# Patient Record
Sex: Female | Born: 1975 | Race: White | Hispanic: No | Marital: Married | Smoking: Never smoker
Health system: Southern US, Academic
[De-identification: ages and names within clinical notes are randomized; demographics above are authoritative.]

## PROBLEM LIST (undated history)

## (undated) HISTORY — PX: HX TONSILLECTOMY: SHX27

## (undated) HISTORY — PX: LYMPH NODE BIOPSY: SHX201

## (undated) HISTORY — PX: HX TUBAL LIGATION: SHX77

---

## 2009-05-06 DIAGNOSIS — C8190 Hodgkin lymphoma, unspecified, unspecified site: Secondary | ICD-10-CM

## 2009-05-06 HISTORY — DX: Hodgkin lymphoma, unspecified, unspecified site (CMS HCC): C81.90

## 2010-05-06 DIAGNOSIS — E039 Hypothyroidism, unspecified: Secondary | ICD-10-CM

## 2010-05-06 HISTORY — DX: Hypothyroidism, unspecified: E03.9

## 2010-10-22 DIAGNOSIS — E039 Hypothyroidism, unspecified: Secondary | ICD-10-CM | POA: Insufficient documentation

## 2014-05-06 DIAGNOSIS — G35 Multiple sclerosis: Secondary | ICD-10-CM

## 2014-05-06 HISTORY — DX: Multiple sclerosis (CMS HCC): G35

## 2014-10-22 DIAGNOSIS — G35 Multiple sclerosis: Secondary | ICD-10-CM | POA: Insufficient documentation

## 2019-05-07 DIAGNOSIS — O24419 Gestational diabetes mellitus in pregnancy, unspecified control: Secondary | ICD-10-CM

## 2019-05-07 HISTORY — DX: Gestational diabetes mellitus in pregnancy, unspecified control: O24.419

## 2019-06-22 DIAGNOSIS — E559 Vitamin D deficiency, unspecified: Secondary | ICD-10-CM | POA: Insufficient documentation

## 2019-06-22 DIAGNOSIS — O0993 Supervision of high risk pregnancy, unspecified, third trimester: Secondary | ICD-10-CM | POA: Insufficient documentation

## 2019-06-22 DIAGNOSIS — O30001 Twin pregnancy, unspecified number of placenta and unspecified number of amniotic sacs, first trimester: Secondary | ICD-10-CM | POA: Insufficient documentation

## 2019-06-25 DIAGNOSIS — Z8571 Personal history of Hodgkin lymphoma: Secondary | ICD-10-CM | POA: Insufficient documentation

## 2019-09-28 DIAGNOSIS — O24419 Gestational diabetes mellitus in pregnancy, unspecified control: Secondary | ICD-10-CM | POA: Insufficient documentation

## 2020-08-17 DIAGNOSIS — R739 Hyperglycemia, unspecified: Secondary | ICD-10-CM | POA: Insufficient documentation

## 2020-08-29 ENCOUNTER — Ambulatory Visit (HOSPITAL_COMMUNITY)
Admission: RE | Admit: 2020-08-29 | Discharge: 2020-08-29 | Disposition: A | Discharge status: Home / Self Care | Payer: Self-pay | Source: Ambulatory Visit

## 2020-12-06 ENCOUNTER — Ambulatory Visit (HOSPITAL_COMMUNITY)
Admission: RE | Admit: 2020-12-06 | Discharge: 2020-12-06 | Disposition: A | Discharge status: Home / Self Care | Payer: Self-pay | Source: Ambulatory Visit

## 2021-08-02 ENCOUNTER — Encounter (RURAL_HEALTH_CENTER): Payer: Self-pay | Admitting: INTERNAL MEDICINE-ENDOCRINOLOGY-DIABETES AND METABOLISM

## 2021-08-02 ENCOUNTER — Ambulatory Visit
Payer: BC Managed Care – PPO | Attending: INTERNAL MEDICINE-ENDOCRINOLOGY-DIABETES AND METABOLISM | Admitting: INTERNAL MEDICINE-ENDOCRINOLOGY-DIABETES AND METABOLISM

## 2021-08-02 ENCOUNTER — Other Ambulatory Visit: Payer: Self-pay

## 2021-08-02 ENCOUNTER — Other Ambulatory Visit (HOSPITAL_COMMUNITY): Payer: Self-pay

## 2021-08-02 VITALS — BP 118/85 | HR 88 | Temp 97.7°F | Resp 17 | Ht 66.0 in | Wt 211.4 lb

## 2021-08-02 DIAGNOSIS — E039 Hypothyroidism, unspecified: Secondary | ICD-10-CM | POA: Insufficient documentation

## 2021-08-02 DIAGNOSIS — Z923 Personal history of irradiation: Secondary | ICD-10-CM | POA: Insufficient documentation

## 2021-08-02 DIAGNOSIS — E162 Hypoglycemia, unspecified: Secondary | ICD-10-CM | POA: Insufficient documentation

## 2021-08-02 DIAGNOSIS — Z8571 Personal history of Hodgkin lymphoma: Secondary | ICD-10-CM | POA: Insufficient documentation

## 2021-08-02 DIAGNOSIS — E042 Nontoxic multinodular goiter: Secondary | ICD-10-CM | POA: Insufficient documentation

## 2021-08-02 DIAGNOSIS — Z6834 Body mass index (BMI) 34.0-34.9, adult: Secondary | ICD-10-CM | POA: Insufficient documentation

## 2021-08-02 DIAGNOSIS — Z79890 Hormone replacement therapy (postmenopausal): Secondary | ICD-10-CM | POA: Insufficient documentation

## 2021-08-02 MED ORDER — WEGOVY 0.25 MG/0.5 ML SUBCUTANEOUS PEN INJECTOR
0.2500 mg | PEN_INJECTOR | SUBCUTANEOUS | 0 refills | Status: DC
Start: 2021-08-02 — End: 2021-08-03

## 2021-08-02 MED ORDER — LEVOTHYROXINE 125 MCG TABLET
125.0000 ug | ORAL_TABLET | Freq: Every morning | ORAL | 1 refills | Status: DC
Start: 2021-08-02 — End: 2022-01-21

## 2021-08-02 NOTE — Progress Notes (Signed)
Family Medicine, [facility]  [place] [address]  [phone]    Date: 08/02/2021  Patient Name:  Nicole Carney   Age: 46 y.o.  Attending: Marena Chancy, MD - Endocrinology, Diabetes and Metabolism  PCP: Ripley Fraise, PA-C    I have personally reviewed allergies, chief complaint, medications, past medical history, surgical history, and family history.  I have reviewed referral notes, PCP progress notes and labs and images relevant to this visit.      Assessment/Recommendations:     1. Multinodular goiter  - 2012 hodgkins lymphoma, radiation to neck/chest  - [place] stable 08/2020 to 12/2020, agree repeat 12/2021  - largest nodule 1.0cm    2. Reactive hypoglycemia  - discussed diet and lifestyle modifications at length  - recommend lose 10-15lbs or 5% of body weight  - if no improvement in sugars, then consider acarbose  - strong family history of type 2 DM and she has a history of gestational DM    3. BMI 34  - start wegovy  - gave written instructions    4. Hypothyroidism  - increase levothyroxine from 112-125 given TSH is 2.7    No follow-ups on file.    Chief Complaint:    Chief Complaint   Patient presents with   . New Patient     hypothyroidism, hypoglycemia referral from [place] [facility]     History of Present Illness:   46 year old female presents for evaluation and management multinodular goiter, hypothyroidism, reactive hypoglycemia.  She has history of radiation for Hodgkin's lymphoma.  Has been on thyroid medications since 2015 or 2016.  TSH was 2.7.  Discussed increasing the dose of the medication.  Thyroid nodules are stable over 6 months. , recommend repeat in 1 year.  Discussed that with her in detail.  Prolonged discussion regarding diet lifestyle modification for reactive hypoglycemia.  Discussed medical weight loss.    History of hodkgins, multiple sclerosis.     Allergies:  Allergies   Allergen Reactions   . Sulfa (Sulfonamides)  Hives/ Urticaria     Medications:    Outpatient Medications Marked as Taking for the 08/02/21 encounter (Office Visit) with Lenward Chancellor, MD   Medication Sig   . levothyroxine (SYNTHROID) 112 mcg Oral Tablet TAKE 1 TABLET BY MOUTH ONCE DAILY FOR 30 DAYS   . phentermine (ADIPEX-P) 37.5 mg Oral Tablet Take 1 Tablet (37.5 mg total) by mouth Every morning before breakfast   . VUMERITY 231 mg Oral Capsule, Delayed Release(E.C.)        Past Medical History:  Past Medical History:   Diagnosis Date   . Gestational diabetes 2021   . Hodgkin's lymphoma (CMS [place]) 2011   . Hypothyroidism 2012   . MS (multiple sclerosis) (CMS [place]) 2016     Past Surgical History:   Procedure Laterality Date   . HX CESAREAN SECTION  2021   . HX TONSILLECTOMY     . HX TUBAL LIGATION     . LYMPH NODE BIOPSY      removal     Social History:    Social History     Socioeconomic History   . Marital status: Married   Tobacco Use   . Smoking status: Never     Passive exposure: Never   . Smokeless tobacco: Never   Vaping Use   . Vaping Use: Never used   Substance and Sexual Activity   . Alcohol use: Never   .  Drug use: Never   . Sexual activity: Yes     Partners: Male     Family History:  Family Medical History:     Problem Relation (Age of Onset)    Cancer Mother, Father    Cerebral Aneurysm Father    Cirrhosis Father    No Known Problems Brother        Review of Systems:   Constitutional: negative for fevers, chills, weight changes  HEENT: negative for acute vision changes or URI symptoms  Neck:  negative dysphonia, dysphagia or neck pain  Respiratory: negative for cough, sputum, dyspnea on exertion  Cardiovascular: negative for chest discomfort, palpitations  Gastrointestinal: negative for nausea, pain, stool changes  Genitourinary: negative for frequency, dysuria, nocturia, hesitancy  Hematologic/lymphatic: negative for changes in bleeding or bruisability  Musculoskeletal:negative for new myalgisa, arthralgia, and muscle weakness  Neurological:  negative for new headaches, vision changes, numbness, weakness, tingling  Psychiatric: mood stable, cognition stable  Endocrine:  As per HPI.    Objective:     Vital Signs:  BP 118/85   Pulse 88   Temp 36.5 C (97.7 F) (Temporal)   Resp 17   Ht 1.676 m ('5\' 6"'$ )   Wt 95.9 kg (211 lb 6.4 oz)   LMP 07/17/2021   SpO2 99%   BMI 34.12 kg/m      Body mass index is 34.12 kg/m.    Examination:  General:  Age-appropriate, no acute distress  HEENT:  Eyes clear.  Extraocular movements intact. Moist mucous membranes.  Neck:  Supple, no thyroid masses or adenopathy.  Cardiac:  Regular rate and rhythm, no gallops, murmurs or rubs.  Pulmonary:  Clear to ausculation bilaterally.  Pulmonary effort normal.  Gastrointestinal:  Nontender, nondistended, bowel sounds positive.    Musculoskeletal:  No deformity or lesions.  Skin:  Warm and of normal moisture.  Neurologic:  Alert and oriented.  Nonfocal.  No tremor.  Gait normal.  Psychiatric:  Affect normal.    Diagnostic Review:      No results found for: HA1C  No results found for: TSH, T3UP, MTUP1, FREET4, T3, THYSTIMUNQNT, THYSTIMMUNOG  No results found for: TSH, TSHCASCADE, T4, T4FREE, T3, FREET3    Marena Chancy, MD  Endocrinology, Diabetes and Metabolism    On the day of the encounter, a total of  60 minutes was spent on this patient encounter including review of historical information, examination, documentation and post-visit activities. The time documented excludes procedural time.    This note may have been partially generated using MModal Fluency Direct system, and there may be some incorrect words, spellings, and punctuation that were not noted in checking the note before saving.

## 2021-08-03 ENCOUNTER — Other Ambulatory Visit (RURAL_HEALTH_CENTER): Payer: Self-pay | Admitting: INTERNAL MEDICINE-ENDOCRINOLOGY-DIABETES AND METABOLISM

## 2021-08-03 ENCOUNTER — Telehealth (RURAL_HEALTH_CENTER): Payer: Self-pay | Admitting: INTERNAL MEDICINE-ENDOCRINOLOGY-DIABETES AND METABOLISM

## 2021-08-03 MED ORDER — OZEMPIC 0.25 MG OR 0.5 MG (2 MG/1.5 ML) SUBCUTANEOUS PEN INJECTOR
PEN_INJECTOR | SUBCUTANEOUS | 0 refills | Status: DC
Start: 2021-08-03 — End: 2021-08-28

## 2021-08-03 NOTE — Telephone Encounter (Signed)
Patient went to pick up her Nicole Carney and her insurance company wont cover it and even with the discount card its still pretty pricey. She called her insurance company to see what else could be done or taken in place and they said Ozempic would be covered she wasn't sure if she was able to switch or not. bnr,ma

## 2021-08-07 ENCOUNTER — Telehealth (RURAL_HEALTH_CENTER): Payer: Self-pay | Admitting: INTERNAL MEDICINE-ENDOCRINOLOGY-DIABETES AND METABOLISM

## 2021-08-07 NOTE — Telephone Encounter (Signed)
Unable to notify patient of scheduled thyroid US appointment on 12/10/2021'@11am'$  at [place].  Order was faxed to office and pt mailed appointment reminder.

## 2021-08-28 ENCOUNTER — Other Ambulatory Visit (RURAL_HEALTH_CENTER): Payer: Self-pay | Admitting: INTERNAL MEDICINE-ENDOCRINOLOGY-DIABETES AND METABOLISM

## 2021-08-28 DIAGNOSIS — R110 Nausea: Secondary | ICD-10-CM

## 2021-08-28 DIAGNOSIS — E669 Obesity, unspecified: Secondary | ICD-10-CM

## 2021-08-28 MED ORDER — ONDANSETRON HCL 4 MG TABLET
4.0000 mg | ORAL_TABLET | Freq: Three times a day (TID) | ORAL | 0 refills | Status: DC | PRN
Start: 2021-08-28 — End: 2021-10-11

## 2021-08-28 MED ORDER — OZEMPIC 0.25 MG OR 0.5 MG (2 MG/3 ML) SUBCUTANEOUS PEN INJECTOR
0.5000 mg | PEN_INJECTOR | SUBCUTANEOUS | 1 refills | Status: DC
Start: 2021-08-28 — End: 2021-10-11

## 2021-08-28 NOTE — Telephone Encounter (Signed)
Patient would like to get a script for Zofran to help with side effect. Also needs a new script for ozempic 0.5 mg.  Nmt, lpn

## 2021-09-21 ENCOUNTER — Ambulatory Visit (RURAL_HEALTH_CENTER): Payer: Self-pay | Admitting: INTERNAL MEDICINE-ENDOCRINOLOGY-DIABETES AND METABOLISM

## 2021-09-27 LAB — HM EGFR: GFR: 114 mL/min/{1.73_m2}

## 2021-09-27 LAB — POCT HGB A1C: POCT HGB A1C: 5.2 % (ref 4–6)

## 2021-09-28 ENCOUNTER — Other Ambulatory Visit (RURAL_HEALTH_CENTER): Payer: Self-pay

## 2021-09-28 ENCOUNTER — Ambulatory Visit (RURAL_HEALTH_CENTER): Payer: Self-pay | Admitting: INTERNAL MEDICINE-ENDOCRINOLOGY-DIABETES AND METABOLISM

## 2021-10-11 ENCOUNTER — Ambulatory Visit
Payer: BC Managed Care – PPO | Attending: Physician Assistant | Admitting: INTERNAL MEDICINE-ENDOCRINOLOGY-DIABETES AND METABOLISM

## 2021-10-11 ENCOUNTER — Encounter (RURAL_HEALTH_CENTER): Payer: Self-pay | Admitting: INTERNAL MEDICINE-ENDOCRINOLOGY-DIABETES AND METABOLISM

## 2021-10-11 ENCOUNTER — Other Ambulatory Visit: Payer: Self-pay

## 2021-10-11 VITALS — BP 125/87 | HR 85 | Temp 97.3°F | Resp 17 | Ht 66.0 in | Wt 202.4 lb

## 2021-10-11 DIAGNOSIS — E042 Nontoxic multinodular goiter: Secondary | ICD-10-CM | POA: Insufficient documentation

## 2021-10-11 DIAGNOSIS — Z79899 Other long term (current) drug therapy: Secondary | ICD-10-CM | POA: Insufficient documentation

## 2021-10-11 DIAGNOSIS — Z79890 Hormone replacement therapy (postmenopausal): Secondary | ICD-10-CM | POA: Insufficient documentation

## 2021-10-11 DIAGNOSIS — E162 Hypoglycemia, unspecified: Secondary | ICD-10-CM | POA: Insufficient documentation

## 2021-10-11 MED ORDER — OZEMPIC 1 MG/DOSE (4 MG/3 ML) SUBCUTANEOUS PEN INJECTOR
1.0000 mg | PEN_INJECTOR | SUBCUTANEOUS | 0 refills | Status: DC
Start: 2021-10-11 — End: 2021-11-14

## 2021-10-11 NOTE — Progress Notes (Signed)
Family Medicine, [facility]  [place] [address]  [phone]    Date: 10/11/2021  Patient Name:  Nicole Carney   Age: 46 y.o.  Attending: Marena Chancy, MD - Endocrinology, Diabetes and Metabolism  PCP: Ripley Fraise, PA-C    I have personally reviewed allergies, chief complaint, medications, past medical history, surgical history, and family history.  I have reviewed referral notes, PCP progress notes and labs and images relevant to this visit.      Assessment/Recommendations:     1. Multinodular goiter  - 2012 hodgkins lymphoma, radiation to neck/chest  - [place] stable 08/2020 to 12/2020, agree repeat 12/2021  - largest nodule 1.0cm    2. Reactive hypoglycemia  - discussed diet and lifestyle modifications at length  - A1C 5.2%, improving with ozempic  - strong family history of type 2 DM and she has a history of gestational DM    3. BMI 34  - semaglutide increase to '1mg'$  weekly  - gave written instructions    4. Hypothyroidism  - TSH at goal on LT4 125 daily, suspect will have to lower the dose if she continues losing weight    No follow-ups on file.    Chief Complaint:    Chief Complaint   Patient presents with   . Follow Up     6wk f\u     History of Present Illness:   46 year old female presents for evaluation and management multinodular goiter, hypothyroidism, reactive hypoglycemia.  She has history of radiation for Hodgkin's lymphoma.  Has been on thyroid medications since 2015 or 2016.  TSH was 2.7.  Discussed increasing the dose of the medication.  Thyroid nodules are stable over 6 months. , recommend repeat in 1 year.  Discussed that with her in detail.  Prolonged discussion regarding diet lifestyle modification for reactive hypoglycemia.  Discussed medical weight loss.    History of hodkgins, multiple sclerosis.     10/11/2021:  46 year old female returns for follow-up.  History multinodular goiter, hypothyroidism and reactive hypoglycemia.   Patient has been on semaglutide for weight loss and hypoglycemia frequency has reduced drastically.  She is losing weight.  Had labs and I reviewed those with her.  No complaints today    Allergies:  Allergies   Allergen Reactions   . Sulfa (Sulfonamides) Hives/ Urticaria     Medications:    Outpatient Medications Marked as Taking for the 10/11/21 encounter (Office Visit) with Lenward Chancellor, MD   Medication Sig   . levothyroxine (SYNTHROID) 125 mcg Oral Tablet Take 1 Tablet (125 mcg total) by mouth Every morning   . rOPINIRole (REQUIP) 0.25 mg Oral Tablet TAKE 1 TABLET BY MOUTH ONCE DAILY 1 TO 3 HOURS BEFORE BEDTIME   . semaglutide (OZEMPIC) 0.25 mg or 0.5 mg (2 mg/3 mL) Subcutaneous Pen Injector Inject 0.5 mg under the skin Every 7 days   . VUMERITY 231 mg Oral Capsule, Delayed Release(E.C.)        Past Medical History:  Past Medical History:   Diagnosis Date   . Gestational diabetes 2021   . Hodgkin's lymphoma (CMS [place]) 2011   . Hypothyroidism 2012   . MS (multiple sclerosis) (CMS [place]) 2016     Past Surgical History:   Procedure Laterality Date   . HX CESAREAN SECTION  2021   . HX TONSILLECTOMY     . HX TUBAL LIGATION     . LYMPH NODE BIOPSY  removal     Social History:    Social History     Socioeconomic History   . Marital status: Married   Tobacco Use   . Smoking status: Never     Passive exposure: Never   . Smokeless tobacco: Never   Vaping Use   . Vaping Use: Never used   Substance and Sexual Activity   . Alcohol use: Never   . Drug use: Never   . Sexual activity: Yes     Partners: Male     Family History:  Family Medical History:     Problem Relation (Age of Onset)    Cancer Mother, Father    Cerebral Aneurysm Father    Cirrhosis Father    No Known Problems Brother        Review of Systems:   Constitutional: negative for fevers, chills, weight changes  HEENT: negative for acute vision changes or URI symptoms  Neck:  negative dysphonia, dysphagia or neck pain  Respiratory: negative for cough, sputum,  dyspnea on exertion  Cardiovascular: negative for chest discomfort, palpitations  Gastrointestinal: negative for nausea, pain, stool changes  Genitourinary: negative for frequency, dysuria, nocturia, hesitancy  Hematologic/lymphatic: negative for changes in bleeding or bruisability  Musculoskeletal:negative for new myalgisa, arthralgia, and muscle weakness  Neurological: negative for new headaches, vision changes, numbness, weakness, tingling  Psychiatric: mood stable, cognition stable  Endocrine:  As per HPI.    Objective:     Vital Signs:  BP 125/87   Pulse 85   Temp 36.3 C (97.3 F)   Resp 17   Ht 1.676 m ('5\' 6"'$ )   Wt 91.8 kg (202 lb 6.4 oz)   SpO2 98%   BMI 32.67 kg/m      Body mass index is 32.67 kg/m.    Examination:  General:  Age-appropriate, no acute distress  HEENT:  Eyes clear.  Extraocular movements intact. Moist mucous membranes.  Neck:  Supple, no thyroid masses or adenopathy.  Cardiac:  Regular rate and rhythm, no gallops, murmurs or rubs.  Pulmonary:  Clear to ausculation bilaterally.  Pulmonary effort normal.  Gastrointestinal:  Nontender, nondistended, bowel sounds positive.    Musculoskeletal:  No deformity or lesions.  Skin:  Warm and of normal moisture.  Neurologic:  Alert and oriented.  Nonfocal.  No tremor.  Gait normal.  Psychiatric:  Affect normal.    Diagnostic Review:      No results found for: HA1C  No results found for: TSH, T3UP, MTUP1, FREET4, T3, THYSTIMUNQNT, THYSTIMMUNOG  No results found for: TSH, TSHCASCADE, T4, T4FREE, T3, FREET3    Marena Chancy, MD  Endocrinology, Diabetes and Metabolism    This note may have been partially generated using MModal Fluency Direct system, and there may be some incorrect words, spellings, and punctuation that were not noted in checking the note before saving.

## 2021-10-19 ENCOUNTER — Other Ambulatory Visit: Payer: Self-pay

## 2021-11-14 ENCOUNTER — Encounter (RURAL_HEALTH_CENTER): Payer: Self-pay | Admitting: INTERNAL MEDICINE-ENDOCRINOLOGY-DIABETES AND METABOLISM

## 2021-11-14 DIAGNOSIS — E669 Obesity, unspecified: Secondary | ICD-10-CM

## 2021-11-14 MED ORDER — OZEMPIC 2 MG/DOSE (8 MG/3 ML) SUBCUTANEOUS PEN INJECTOR
2.0000 mg | PEN_INJECTOR | SUBCUTANEOUS | 5 refills | Status: DC
Start: 2021-11-14 — End: 2022-05-28

## 2021-11-14 NOTE — Telephone Encounter (Signed)
From: Alecia Lemming  To: Lenward Chancellor, MD  Sent: 11/14/2021 9:31 AM EDT  Subject: Ozempic     I need to go up in dose for Sunday. Can you send me in a new script for it please. Thank you.

## 2021-11-22 ENCOUNTER — Encounter (RURAL_HEALTH_CENTER): Payer: Self-pay | Admitting: INTERNAL MEDICINE-ENDOCRINOLOGY-DIABETES AND METABOLISM

## 2021-12-17 ENCOUNTER — Encounter (RURAL_HEALTH_CENTER): Payer: Self-pay | Admitting: INTERNAL MEDICINE-ENDOCRINOLOGY-DIABETES AND METABOLISM

## 2022-01-03 ENCOUNTER — Encounter (RURAL_HEALTH_CENTER): Payer: Self-pay | Admitting: INTERNAL MEDICINE-ENDOCRINOLOGY-DIABETES AND METABOLISM

## 2022-01-03 ENCOUNTER — Other Ambulatory Visit: Payer: Self-pay

## 2022-01-03 ENCOUNTER — Ambulatory Visit: Payer: BC Managed Care – PPO | Admitting: INTERNAL MEDICINE-ENDOCRINOLOGY-DIABETES AND METABOLISM

## 2022-01-03 VITALS — BP 132/90 | HR 87 | Temp 97.5°F | Resp 17 | Ht 65.98 in | Wt 189.4 lb

## 2022-01-03 DIAGNOSIS — E161 Other hypoglycemia: Secondary | ICD-10-CM | POA: Insufficient documentation

## 2022-01-03 DIAGNOSIS — E669 Obesity, unspecified: Secondary | ICD-10-CM | POA: Insufficient documentation

## 2022-01-03 DIAGNOSIS — E042 Nontoxic multinodular goiter: Secondary | ICD-10-CM | POA: Insufficient documentation

## 2022-01-03 DIAGNOSIS — Z800 Family history of malignant neoplasm of digestive organs: Secondary | ICD-10-CM | POA: Insufficient documentation

## 2022-01-03 DIAGNOSIS — E039 Hypothyroidism, unspecified: Secondary | ICD-10-CM | POA: Insufficient documentation

## 2022-01-03 DIAGNOSIS — Z6834 Body mass index (BMI) 34.0-34.9, adult: Secondary | ICD-10-CM | POA: Insufficient documentation

## 2022-01-03 NOTE — Progress Notes (Signed)
Family Medicine, [facility]  [place] [address]  [phone]    Date: 01/03/2022  Patient Name:  Nicole Carney   Age: 46 y.o.  Attending: Marena Chancy, MD - Endocrinology, Diabetes and Metabolism  PCP: Ripley Fraise, PA-C    I have personally reviewed allergies, chief complaint, medications, past medical history, surgical history, and family history.  I have reviewed referral notes, PCP progress notes and labs and images relevant to this visit.      Assessment/Recommendations:     1. Multinodular goiter  - 2012 hodgkins lymphoma, radiation to neck/chest  - [place] stable 08/2021, repeat 1 year. Recommend 5 years of monitoring this is year 2  - largest nodule 1.0cm    2. Reactive hypoglycemia  - discussed diet and lifestyle modifications at length  - A1C 5.2%, improving with ozempic  - strong family history of type 2 DM and she has a history of gestational DM    3. BMI 34  - semaglutide '2mg'$  weekly    4. Hypothyroidism  - TSH at goal on LT4 125 daily, suspect will have to lower the dose if she continues losing weight    5. Family history of pancreatic cancer  - strong family history, mother  - needs to have imaging either EUS or MRI yearly. There are new guidelines for this. Recommend she see gastroenterology, will refer to GI at [facility]    No follow-ups on file.    Chief Complaint:    Chief Complaint   Patient presents with    Follow Up 3 Months     History of Present Illness:   46 year old female presents for evaluation and management multinodular goiter, hypothyroidism, reactive hypoglycemia.  She has history of radiation for Hodgkin's lymphoma.  Has been on thyroid medications since 2015 or 2016.  TSH was 2.7.  Discussed increasing the dose of the medication.  Thyroid nodules are stable over 6 months. , recommend repeat in 1 year.  Discussed that with her in detail.  Prolonged discussion regarding diet lifestyle modification for reactive  hypoglycemia.  Discussed medical weight loss.    History of hodkgins, multiple sclerosis.     10/11/2021:  46 year old female returns for follow-up.  History multinodular goiter, hypothyroidism and reactive hypoglycemia.  Patient has been on semaglutide for weight loss and hypoglycemia frequency has reduced drastically.  She is losing weight.  Had labs and I reviewed those with her.  No complaints today    01/03/2022:  46 year old female returns for follow-up.  History of multinodular goiter, hypothyroidism and reactive hypoglycemia.  She is on semaglutide and all of the sugar symptoms have improved.  She is losing weight.  Thyroid nodules are stable.  She has a strong family history of pancreatic cancer.  Has had CA 19 9 testing but not imaging.  I discussed the new guidelines recommend imaging, the frequency the pens on the earliest age of the family members with pancreatic cancer.  She needs to see a gastroenterologist for the screening.    Allergies:  Allergies   Allergen Reactions    Sulfa (Sulfonamides) Hives/ Urticaria     Medications:    Outpatient Medications Marked as Taking for the 01/03/22 encounter (Office Visit) with Lenward Chancellor, MD   Medication Sig    phentermine (ADIPEX-P) 37.5 mg Oral Tablet Take 1 Tablet (37.5 mg total) by mouth Once a day       Past Medical History:  Past Medical History:  Diagnosis Date    Gestational diabetes 2021    Hodgkin's lymphoma (CMS [facility]) 2011    Hypothyroidism 2012    MS (multiple sclerosis) (CMS [place]) 2016     Past Surgical History:   Procedure Laterality Date    HX CESAREAN SECTION  2021    HX TONSILLECTOMY      HX TUBAL LIGATION      LYMPH NODE BIOPSY      removal     Social History:    Social History     Socioeconomic History    Marital status: Married   Tobacco Use    Smoking status: Never     Passive exposure: Never    Smokeless tobacco: Never   Vaping Use    Vaping Use: Never used   Substance and Sexual Activity    Alcohol use: Never    Drug use: Never    Sexual  activity: Yes     Partners: Male     Family History:  Family Medical History:       Problem Relation (Age of Onset)    Cancer Mother, Father    Cerebral Aneurysm Father    Cirrhosis Father    No Known Problems Brother          Objective:     Vital Signs:  There were no vitals taken for this visit.     There is no height or weight on file to calculate BMI.    Examination:  General:  Age-appropriate, no acute distress  HEENT:  Eyes clear.  Extraocular movements intact. Moist mucous membranes.  Neck:  Supple, no thyroid masses or adenopathy.  Cardiac:  Regular rate and rhythm, no gallops, murmurs or rubs.  Pulmonary:  Clear to ausculation bilaterally.  Pulmonary effort normal.  Gastrointestinal:  Nontender, nondistended, bowel sounds positive.    Musculoskeletal:  No deformity or lesions.  Skin:  Warm and of normal moisture.  Neurologic:  Alert and oriented.  Nonfocal.  No tremor.  Gait normal.  Psychiatric:  Affect normal.    Diagnostic Review:      No results found for: HA1C  No results found for: TSH, T3UP, MTUP1, FREET4, T3, THYSTIMUNQNT, THYSTIMMUNOG  No results found for: TSH, TSHCASCADE, T4, T4FREE, T3, FREET3    Marena Chancy, MD  Endocrinology, Diabetes and Metabolism    This note may have been partially generated using MModal Fluency Direct system, and there may be some incorrect words, spellings, and punctuation that were not noted in checking the note before saving.

## 2022-01-11 ENCOUNTER — Ambulatory Visit (RURAL_HEALTH_CENTER): Payer: Self-pay | Admitting: INTERNAL MEDICINE-ENDOCRINOLOGY-DIABETES AND METABOLISM

## 2022-01-11 IMAGING — MR MRI BRAIN W/O CONTRAST
9 of 11 series · 38 of 48 positions shown · non-contrast
Comparison: MRI brain dated 02/05/2021.  Prior outside MRI brain dated 02/16/2020, 02/12/2019.

﻿EXAM:  MRI BRAIN W/O CONTRAST
INDICATION: 46-year-old for follow-up of multiple sclerosis.  History of Hodgkin’s lymphoma.
TECHNIQUE: Axial, coronal and sagittal images following MS protocol including axial and sagittal FLAIR sequences without contrast.

[Series 4: s-map · sagittal · 9.4mm · 4.69mm/px · 6 of 64 slices shown]
[im 1/64]
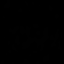
[im 13/64]
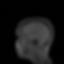
[im 26/64]
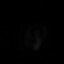
[im 38/64]
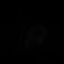
[im 51/64]
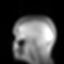
[im 64/64]
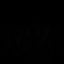

[Series 5: DWI · axial · 5.0mm · 1.35mm/px · z∈[-72,+66]mm · 8 of 96 slices shown (1 of 3)]
[im 1/96]
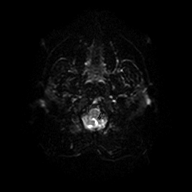
[im 11/96]
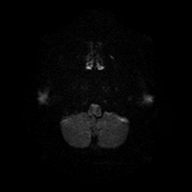
[im 32/96]
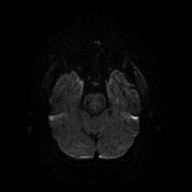
[im 43/96]
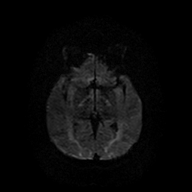
[im 53/96]
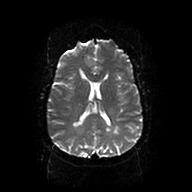
[im 64/96]
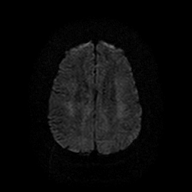
[im 85/96]
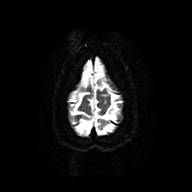
[im 96/96]
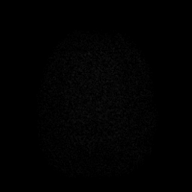

[Series 6: DWI · axial · 5.0mm · 1.35mm/px · z∈[-72,+66]mm · 3 of 24 slices shown (2 of 3)]
[im 1/24]
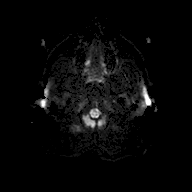
[im 12/24]
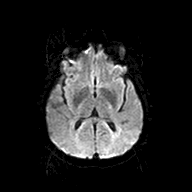
[im 24/24]
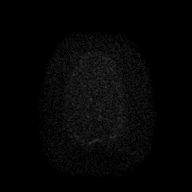

[Series 7: DWI · axial · 5.0mm · 1.35mm/px · z∈[-72,+66]mm · 3 of 24 slices shown (3 of 3)]
[im 1/24]
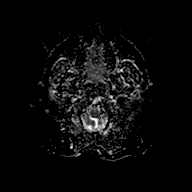
[im 12/24]
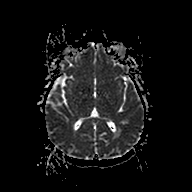
[im 24/24]
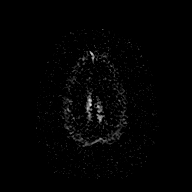

[Series 8: FLAIR · sagittal · 5.0mm · 0.75mm/px · 3 of 28 slices shown (1 of 2)]
[im 1/28]
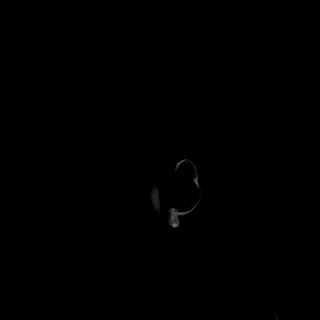
[im 14/28]
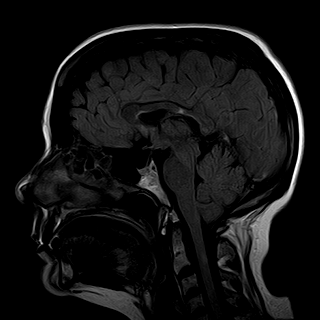
[im 28/28]
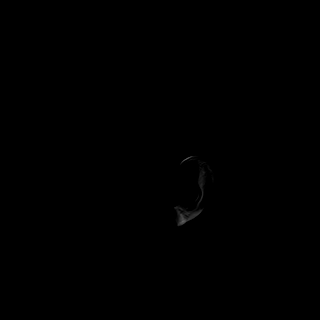

[Series 9: PD · axial · 4.0mm · 0.43mm/px · z∈[-79,+75]mm · 4 of 36 slices shown]
[im 1/36]
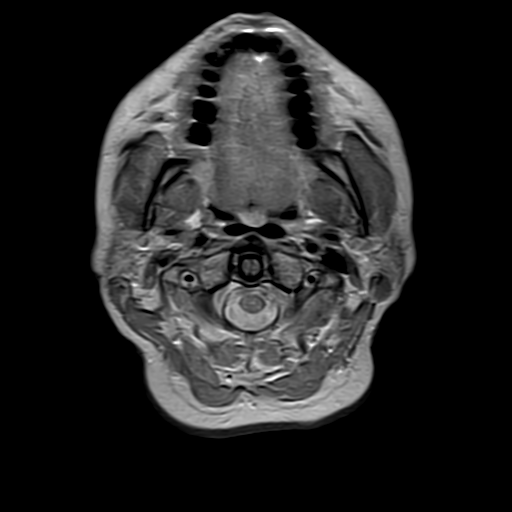
[im 12/36]
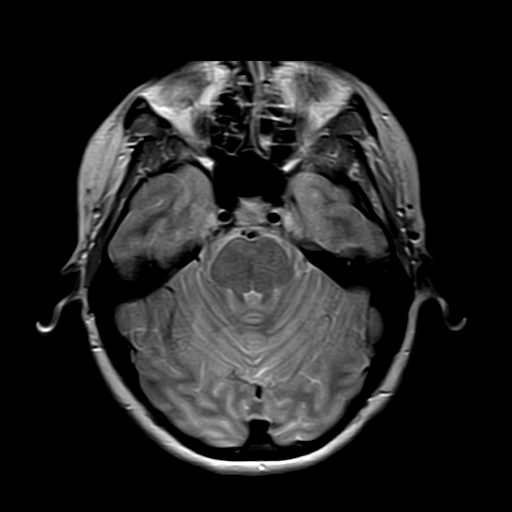
[im 24/36]
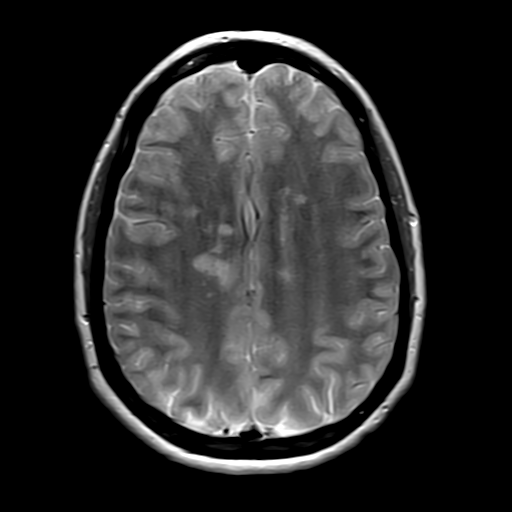
[im 36/36]
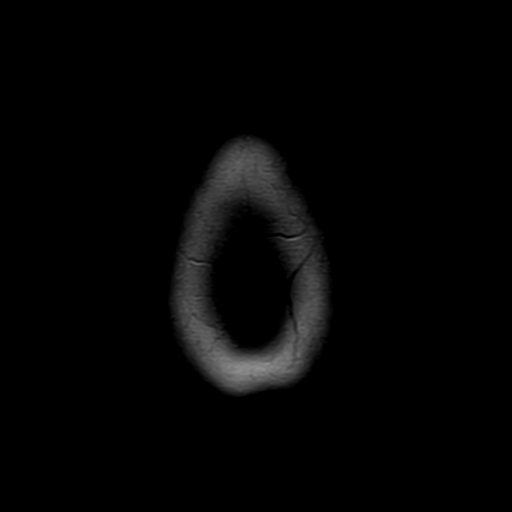

[Series 11: FLAIR · axial · 4.0mm · 0.43mm/px · z∈[-79,+75]mm · 4 of 36 slices shown (2 of 2)]
[im 1/36]
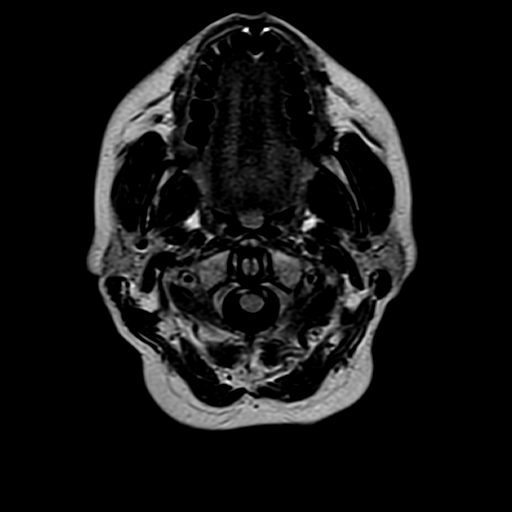
[im 12/36]
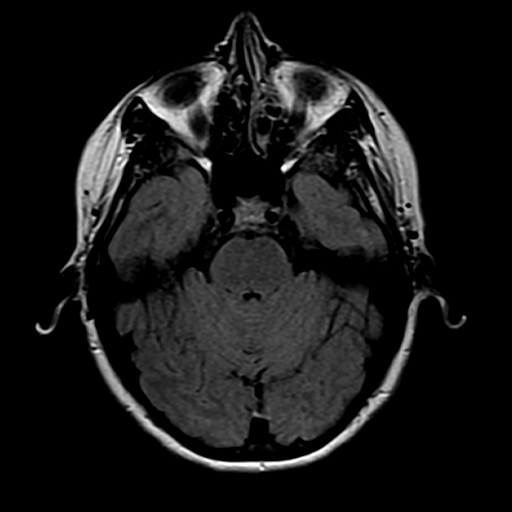
[im 24/36]
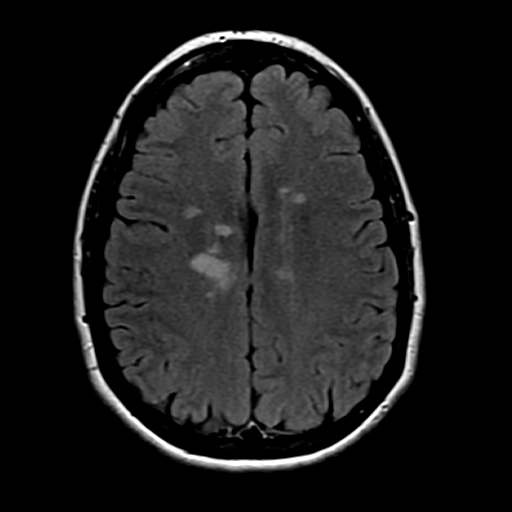
[im 36/36]
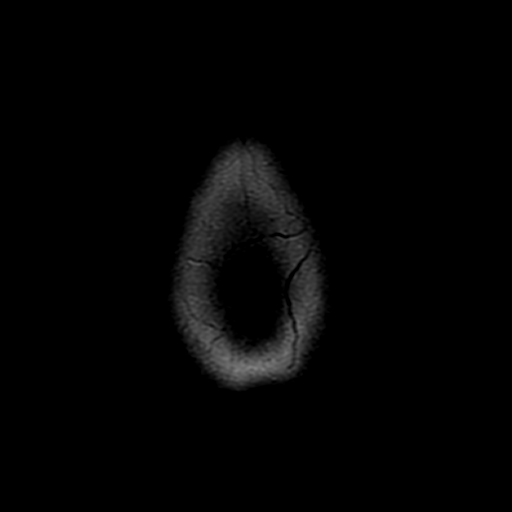

[Series 13: T1 · axial · 4.0mm · 0.43mm/px · z∈[-79,+75]mm · 4 of 36 slices shown]
[im 1/36]
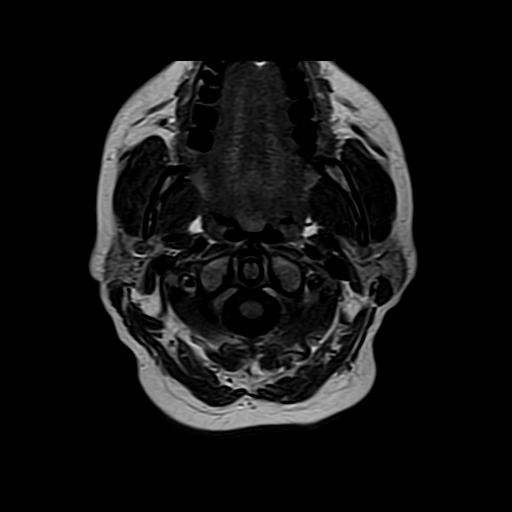
[im 12/36]
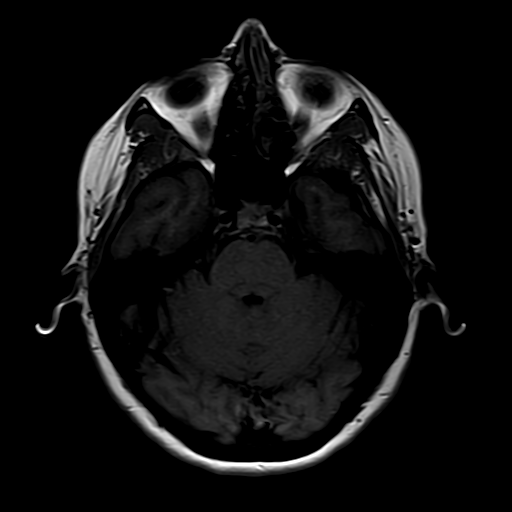
[im 24/36]
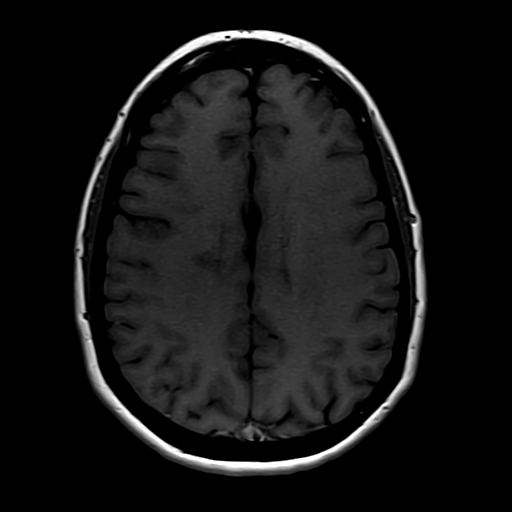
[im 36/36]
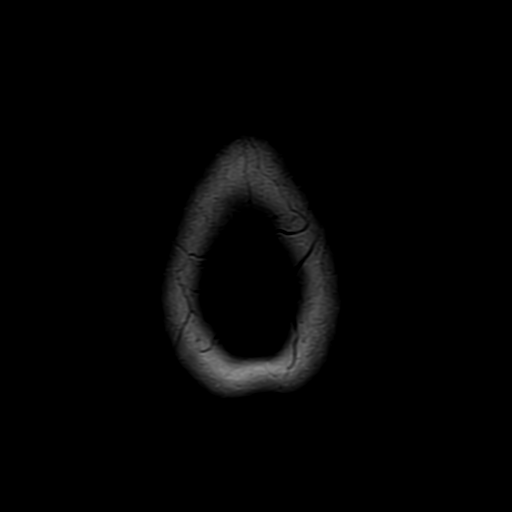

[Series 14: T2 · coronal · 5.2mm · 0.43mm/px · 3 of 28 slices shown]
[im 1/28]
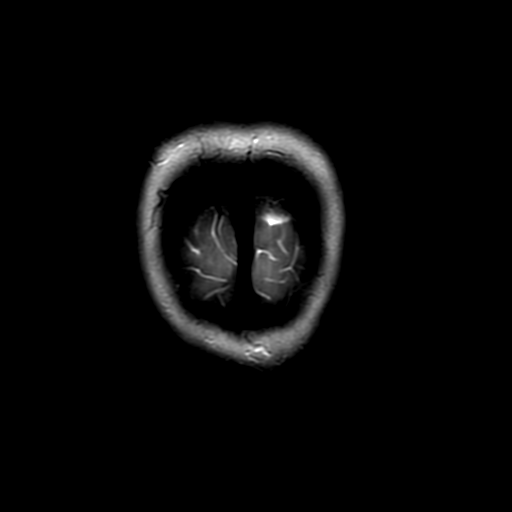
[im 14/28]
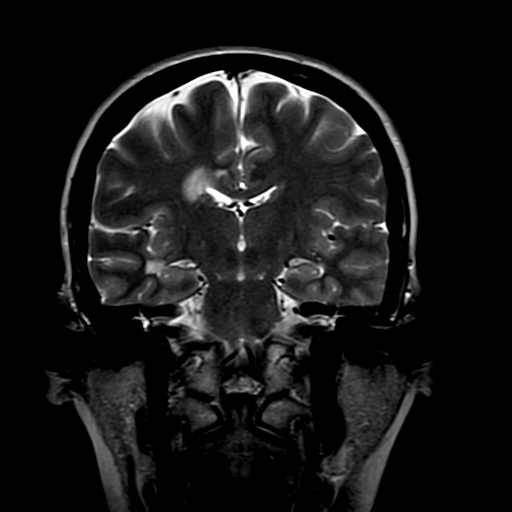
[im 28/28]
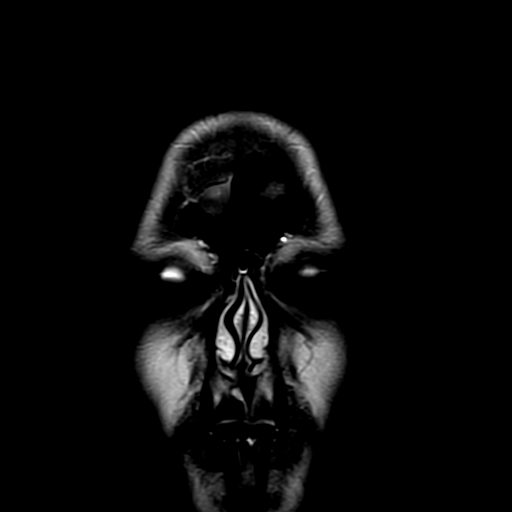

[38 of 48 positions shown; findings below may reference images not displayed]

FINDINGS: No focal restricted diffusion.  Multiple T2 FLAIR bright lesions of periventricular white matter are noted in typical distribution of multiple sclerosis.  Approximately 8-10 T2, FLAIR bright lesions of the right side are noted measuring up to 17 mm in long axis and up to 4-5 on the left side measuring up to 14 mm in long axis.  FLAIR bright lesion on the body of the corpus callosum is also noted.  

No abnormal FLAIR T2 bright lesions of the brainstem or cerebellum are seen.  However, ill-defined focus of increased FLAIR signal of cervical spinal cord at C2-C3 level is noted.  

No evidence of cerebral cortical atrophy.  Major vascular structures are patent.
IMPRESSION: 1. Multiple periventricular demyelinating lesions typical of multiple sclerosis, right more than the left and FLAIR bright lesion of the body of the corpus callosum are noted.  

2. Small focus of FLAIR bright lesion of cervical spinal cord at C2-C3 level.  

3. No focal areas of restricted diffusion. 

4. Overall, MRI findings consistent with multiple sclerosis are stable in appearance compared with prior MRI study of 02/05/2021 as well as 02/16/2020.

## 2022-01-21 ENCOUNTER — Other Ambulatory Visit (RURAL_HEALTH_CENTER): Payer: Self-pay | Admitting: INTERNAL MEDICINE-ENDOCRINOLOGY-DIABETES AND METABOLISM

## 2022-01-21 DIAGNOSIS — E039 Hypothyroidism, unspecified: Secondary | ICD-10-CM

## 2022-01-21 MED ORDER — LEVOTHYROXINE 125 MCG TABLET
125.0000 ug | ORAL_TABLET | Freq: Every morning | ORAL | 1 refills | Status: DC
Start: 2022-01-21 — End: 2022-07-04

## 2022-02-10 ENCOUNTER — Encounter (RURAL_HEALTH_CENTER): Payer: Self-pay | Admitting: INTERNAL MEDICINE-ENDOCRINOLOGY-DIABETES AND METABOLISM

## 2022-02-11 ENCOUNTER — Other Ambulatory Visit (RURAL_HEALTH_CENTER): Payer: Self-pay | Admitting: INTERNAL MEDICINE-ENDOCRINOLOGY-DIABETES AND METABOLISM

## 2022-02-11 DIAGNOSIS — E162 Hypoglycemia, unspecified: Secondary | ICD-10-CM

## 2022-02-11 MED ORDER — BLOOD-GLUCOSE METER
0 refills | Status: AC
Start: 2022-02-11 — End: 2022-02-12

## 2022-02-11 MED ORDER — LANCETS 26 GAUGE
3 refills | Status: AC
Start: 2022-02-11 — End: ?

## 2022-02-11 MED ORDER — BLOOD GLUCOSE TEST STRIPS
ORAL_STRIP | 3 refills | Status: AC
Start: 2022-02-11 — End: ?

## 2022-02-11 NOTE — Telephone Encounter (Signed)
Patient requests a new meter.  Nmt, lpn

## 2022-02-20 ENCOUNTER — Encounter (RURAL_HEALTH_CENTER): Payer: Self-pay | Admitting: INTERNAL MEDICINE-ENDOCRINOLOGY-DIABETES AND METABOLISM

## 2022-03-04 ENCOUNTER — Other Ambulatory Visit (RURAL_HEALTH_CENTER): Payer: Self-pay | Admitting: INTERNAL MEDICINE-ENDOCRINOLOGY-DIABETES AND METABOLISM

## 2022-03-04 DIAGNOSIS — E039 Hypothyroidism, unspecified: Secondary | ICD-10-CM

## 2022-03-05 ENCOUNTER — Encounter (RURAL_HEALTH_CENTER): Payer: Self-pay | Admitting: INTERNAL MEDICINE-ENDOCRINOLOGY-DIABETES AND METABOLISM

## 2022-05-01 ENCOUNTER — Encounter (RURAL_HEALTH_CENTER): Payer: Self-pay | Admitting: INTERNAL MEDICINE-ENDOCRINOLOGY-DIABETES AND METABOLISM

## 2022-05-28 ENCOUNTER — Other Ambulatory Visit (RURAL_HEALTH_CENTER): Payer: Self-pay | Admitting: INTERNAL MEDICINE-ENDOCRINOLOGY-DIABETES AND METABOLISM

## 2022-05-28 DIAGNOSIS — E669 Obesity, unspecified: Secondary | ICD-10-CM

## 2022-05-28 NOTE — Telephone Encounter (Signed)
Pharmacy requested refill.bnr,ma

## 2022-06-27 LAB — POCT HGB A1C: POCT HGB A1C: 5 % (ref 4–6)

## 2022-06-27 LAB — HM EGFR: GFR: 93 mL/min/{1.73_m2}

## 2022-06-28 ENCOUNTER — Other Ambulatory Visit (RURAL_HEALTH_CENTER): Payer: Self-pay | Admitting: INTERNAL MEDICINE-ENDOCRINOLOGY-DIABETES AND METABOLISM

## 2022-06-28 ENCOUNTER — Encounter (RURAL_HEALTH_CENTER): Payer: Self-pay | Admitting: INTERNAL MEDICINE-ENDOCRINOLOGY-DIABETES AND METABOLISM

## 2022-06-28 DIAGNOSIS — E669 Obesity, unspecified: Secondary | ICD-10-CM

## 2022-06-28 MED ORDER — OZEMPIC 2 MG/DOSE (8 MG/3 ML) SUBCUTANEOUS PEN INJECTOR
PEN_INJECTOR | SUBCUTANEOUS | 1 refills | Status: DC
Start: 2022-06-28 — End: 2022-08-21

## 2022-06-28 NOTE — Telephone Encounter (Signed)
Patient requested refill.bnr,ma

## 2022-07-04 ENCOUNTER — Other Ambulatory Visit: Payer: Self-pay

## 2022-07-04 ENCOUNTER — Ambulatory Visit
Payer: BC Managed Care – PPO | Attending: INTERNAL MEDICINE-ENDOCRINOLOGY-DIABETES AND METABOLISM | Admitting: INTERNAL MEDICINE-ENDOCRINOLOGY-DIABETES AND METABOLISM

## 2022-07-04 ENCOUNTER — Encounter (RURAL_HEALTH_CENTER): Payer: Self-pay

## 2022-07-04 ENCOUNTER — Encounter (RURAL_HEALTH_CENTER): Payer: Self-pay | Admitting: INTERNAL MEDICINE-ENDOCRINOLOGY-DIABETES AND METABOLISM

## 2022-07-04 VITALS — BP 117/77 | HR 82 | Temp 97.7°F | Resp 17 | Ht 65.98 in | Wt 182.4 lb

## 2022-07-04 DIAGNOSIS — Z79890 Hormone replacement therapy (postmenopausal): Secondary | ICD-10-CM | POA: Insufficient documentation

## 2022-07-04 DIAGNOSIS — Z7985 Long-term (current) use of injectable non-insulin antidiabetic drugs: Secondary | ICD-10-CM | POA: Insufficient documentation

## 2022-07-04 DIAGNOSIS — E042 Nontoxic multinodular goiter: Secondary | ICD-10-CM | POA: Insufficient documentation

## 2022-07-04 DIAGNOSIS — E039 Hypothyroidism, unspecified: Secondary | ICD-10-CM | POA: Insufficient documentation

## 2022-07-04 DIAGNOSIS — E1165 Type 2 diabetes mellitus with hyperglycemia: Secondary | ICD-10-CM | POA: Insufficient documentation

## 2022-07-04 DIAGNOSIS — Z800 Family history of malignant neoplasm of digestive organs: Secondary | ICD-10-CM | POA: Insufficient documentation

## 2022-07-04 MED ORDER — LEVOTHYROXINE 137 MCG TABLET
137.0000 ug | ORAL_TABLET | Freq: Every morning | ORAL | 0 refills | Status: DC
Start: 2022-07-04 — End: 2022-10-01

## 2022-07-04 MED ORDER — MOUNJARO 10 MG/0.5 ML SUBCUTANEOUS PEN INJECTOR
10.0000 mg | PEN_INJECTOR | SUBCUTANEOUS | 0 refills | Status: DC
Start: 2022-07-04 — End: 2022-08-21

## 2022-07-04 NOTE — Progress Notes (Signed)
Family Medicine, [facility]  [place] [address]  [phone]    Date: 07/04/2022  Patient Name:  Nicole Carney   Age: 47 y.o.  Attending: Marena Chancy, MD - Endocrinology, Diabetes and Metabolism  PCP: Ripley Fraise, PA-C    I have personally reviewed allergies, chief complaint, medications, past medical history, surgical history, and family history.  I have reviewed referral notes, PCP progress notes and labs and images relevant to this visit.      Assessment/Recommendations:     1. Multinodular goiter  - 2012 hodgkins lymphoma, radiation to neck/chest  - [place] stable 08/2021, repeat 1 year. Recommend 5 years of monitoring this is year 2  - largest nodule 1.0cm  - repeat US ordered 08/2022 she will have in [place]    2. Type 2 diabetes mellitus, controlled  - discussed diet and lifestyle modifications at length  - A1C 5.2%  - change ozempic to mounjaro '10mg'$  weekly  - strong family history of type 2 DM and she has a history of gestational DM    3. Hypothyroidism  - TSH 1.4 and levothyroxine 125, she wants to increase the dose if possible she has having a lot of fatigue  - increase to 137 and repeat labs in 3 months    5. Family history of pancreatic cancer  - strong family history, mother  - needs to have imaging either EUS or MRI yearly. There are new guidelines for this. Recommend she see gastroenterology  - previously referred to GI at [facility]    No follow-ups on file.    Chief Complaint:    Chief Complaint   Patient presents with    Follow Up 6 Months     History of Present Illness:   47 year old female presents for evaluation and management multinodular goiter, hypothyroidism, reactive hypoglycemia.  She has history of radiation for Hodgkin's lymphoma.  Has been on thyroid medications since 2015 or 2016.  TSH was 2.7.  Discussed increasing the dose of the medication.  Thyroid nodules are stable over 6 months. , recommend repeat in 1 year.   Discussed that with her in detail.  Prolonged discussion regarding diet lifestyle modification for reactive hypoglycemia.  Discussed medical weight loss.    History of hodkgins, multiple sclerosis.     10/11/2021:  47 year old female returns for follow-up.  History multinodular goiter, hypothyroidism and reactive hypoglycemia.  Patient has been on semaglutide for weight loss and hypoglycemia frequency has reduced drastically.  She is losing weight.  Had labs and I reviewed those with her.  No complaints today    01/03/2022:  47 year old female returns for follow-up.  History of multinodular goiter, hypothyroidism and reactive hypoglycemia.  She is on semaglutide and all of the sugar symptoms have improved.  She is losing weight.  Thyroid nodules are stable.  She has a strong family history of pancreatic cancer.  Has had CA 19 9 testing but not imaging.  I discussed the new guidelines recommend imaging, the frequency the pens on the earliest age of the family members with pancreatic cancer.  She needs to see a gastroenterologist for the screening.    07/04/2022:  47 year old female returns for follow-up.  History of multinodular goiter, hypothyroidism and type 2 diabetes mellitus.  Weight loss has marginally plateaued.  Discussed Darcel Bayley would be more helpful.  Sugar control has been great since she started Ozempic.    Allergies:  Allergies   Allergen Reactions  Sulfa (Sulfonamides) Hives/ Urticaria     Medications:    Outpatient Medications Marked as Taking for the 07/04/22 encounter (Office Visit) with Lenward Chancellor, MD   Medication Sig    BD LUER-LOK SYRINGE 3 mL 25 gauge x 1" Syringe Every 14 days    Blood Sugar Diagnostic (BLOOD GLUCOSE TEST) Strip Check blood sugar three times daily    cyanocobalamin (VITAMIN B12) 1,000 mcg/mL Injection Solution Inject 1 mL (1,000 mcg total) into the muscle Every 14 days    ergocalciferol, vitamin D2, (DRISDOL) 1,250 mcg (50,000 unit) Oral Capsule Take 1 Capsule (50,000 Units  total) by mouth Every 7 days    lancets (UNIVERSAL 1 LANCETS) 26 gauge Misc Fingersticks 3 times daily    levothyroxine (SYNTHROID) 125 mcg Oral Tablet Take 1 Tablet (125 mcg total) by mouth Every morning    OZEMPIC 2 mg/dose (8 mg/3 mL) Subcutaneous Pen Injector INJECT '2MG'$  SUBCUTANEOUSLY ONCE WEEKLY (Patient taking differently: Inject 2 mg under the skin Every 7 days INJECT '2MG'$  SUBCUTANEOUSLY ONCE WEEKLY)    phentermine (ADIPEX-P) 37.5 mg Oral Tablet Take 1 Tablet (37.5 mg total) by mouth Once a day    rOPINIRole (REQUIP) 0.25 mg Oral Tablet Take 1 Tablet (0.25 mg total) by mouth Twice daily    VUMERITY 231 mg Oral Capsule, Delayed Release(E.C.) Take 1 Capsule (231 mg total) by mouth Once a day       Past Medical History:  Past Medical History:   Diagnosis Date    Gestational diabetes 2021    Hodgkin's lymphoma (CMS [facility]) 2011    Hypothyroidism 2012    MS (multiple sclerosis) (CMS [facility]) 2016     Past Surgical History:   Procedure Laterality Date    HX CESAREAN SECTION  2021    HX TONSILLECTOMY      HX TUBAL LIGATION      LYMPH NODE BIOPSY      removal     Social History:    Social History     Socioeconomic History    Marital status: Married   Tobacco Use    Smoking status: Never     Passive exposure: Never    Smokeless tobacco: Never   Vaping Use    Vaping status: Never Used   Substance and Sexual Activity    Alcohol use: Never    Drug use: Never    Sexual activity: Yes     Partners: Male     Social Determinants of Health     Financial Resource Strain: Low Risk  (07/04/2022)    Financial Resource Strain     SDOH Financial: No   Transportation Needs: Low Risk  (07/04/2022)    Transportation Needs     SDOH Transportation: No   Social Connections: Low Risk  (07/04/2022)    Social Connections     SDOH Social Isolation: 5 or more times a week   Intimate Partner Violence: Low Risk  (07/04/2022)    Intimate Partner Violence     SDOH Domestic Violence: No   Housing Stability: Low Risk  (07/04/2022)    Housing Stability     SDOH  Housing Situation: I have housing.     SDOH Housing Worry: No     Family History:  Family Medical History:       Problem Relation (Age of Onset)    Cancer Mother, Father    Cerebral Aneurysm Father    Cirrhosis Father    No Known Problems Brother  Objective:     Vital Signs:  BP 117/77   Pulse 82   Temp 36.5 C (97.7 F) (Temporal)   Resp 17   Ht 1.676 m (5' 5.98")   Wt 82.7 kg (182 lb 6.4 oz)   LMP 06/18/2022   SpO2 99%   BMI 29.45 kg/m      Body mass index is 29.45 kg/m.    Examination:  General:  Age-appropriate, no acute distress  HEENT:  Eyes clear.  Extraocular movements intact. Moist mucous membranes.  Neck:  Supple, no thyroid masses or adenopathy.  Cardiac:  Regular rate and rhythm, no gallops, murmurs or rubs.  Pulmonary:  Clear to ausculation bilaterally.  Pulmonary effort normal.  Gastrointestinal:  Nontender, nondistended, bowel sounds positive.    Musculoskeletal:  No deformity or lesions.  Skin:  Warm and of normal moisture.  Neurologic:  Alert and oriented.  Nonfocal.  No tremor.  Gait normal.  Psychiatric:  Affect normal.    Diagnostic Review:      No results found for: "HA1C"  No results found for: "TSH", "T3UP", "MTUP1", "FREET4", "T3", "THYSTIMUNQNT", "THYSTIMMUNOG"  No results found for: "TSH", "TSHCASCADE", "T4", "T4FREE", "T3", "FREET3"    Marena Chancy, MD  Endocrinology, Diabetes and Metabolism    This note may have been partially generated using MModal Fluency Direct system, and there may be some incorrect words, spellings, and punctuation that were not noted in checking the note before saving.

## 2022-07-05 ENCOUNTER — Other Ambulatory Visit (RURAL_HEALTH_CENTER): Payer: Self-pay

## 2022-07-08 ENCOUNTER — Encounter (RURAL_HEALTH_CENTER): Payer: Self-pay

## 2022-07-10 ENCOUNTER — Ambulatory Visit (HOSPITAL_COMMUNITY): Payer: Self-pay

## 2022-08-09 ENCOUNTER — Encounter (RURAL_HEALTH_CENTER): Payer: Self-pay | Admitting: INTERNAL MEDICINE-ENDOCRINOLOGY-DIABETES AND METABOLISM

## 2022-08-21 ENCOUNTER — Other Ambulatory Visit (RURAL_HEALTH_CENTER): Payer: Self-pay | Admitting: INTERNAL MEDICINE-ENDOCRINOLOGY-DIABETES AND METABOLISM

## 2022-08-21 ENCOUNTER — Encounter (RURAL_HEALTH_CENTER): Payer: Self-pay | Admitting: INTERNAL MEDICINE-ENDOCRINOLOGY-DIABETES AND METABOLISM

## 2022-08-21 MED ORDER — MOUNJARO 10 MG/0.5 ML SUBCUTANEOUS PEN INJECTOR
10.0000 mg | PEN_INJECTOR | SUBCUTANEOUS | 0 refills | Status: DC
Start: 2022-08-21 — End: 2022-10-08

## 2022-08-22 ENCOUNTER — Encounter (RURAL_HEALTH_CENTER): Payer: Self-pay

## 2022-09-17 ENCOUNTER — Encounter (RURAL_HEALTH_CENTER): Payer: Self-pay | Admitting: INTERNAL MEDICINE-ENDOCRINOLOGY-DIABETES AND METABOLISM

## 2022-09-17 ENCOUNTER — Other Ambulatory Visit (RURAL_HEALTH_CENTER): Payer: Self-pay | Admitting: INTERNAL MEDICINE-ENDOCRINOLOGY-DIABETES AND METABOLISM

## 2022-09-17 DIAGNOSIS — E1165 Type 2 diabetes mellitus with hyperglycemia: Secondary | ICD-10-CM

## 2022-09-17 MED ORDER — MOUNJARO 12.5 MG/0.5 ML SUBCUTANEOUS PEN INJECTOR
12.5000 mg | PEN_INJECTOR | SUBCUTANEOUS | 0 refills | Status: DC
Start: 2022-09-17 — End: 2022-10-08

## 2022-09-17 NOTE — Telephone Encounter (Signed)
Can we go up on the dosage of Mounjaro? I've done my last dose of 10mg  and [facility] and everywhere else are having problems getting that dosage (10mg ) in stock, is what the pharmacist are telling.      Thank you

## 2022-09-23 LAB — HM EGFR: GFR: 113 mL/min/{1.73_m2}

## 2022-09-23 LAB — POCT HGB A1C: POCT HGB A1C: 5.2 % (ref 4–6)

## 2022-09-24 ENCOUNTER — Other Ambulatory Visit (RURAL_HEALTH_CENTER): Payer: Self-pay

## 2022-10-01 ENCOUNTER — Other Ambulatory Visit (RURAL_HEALTH_CENTER): Payer: Self-pay | Admitting: INTERNAL MEDICINE-ENDOCRINOLOGY-DIABETES AND METABOLISM

## 2022-10-01 ENCOUNTER — Encounter (RURAL_HEALTH_CENTER): Payer: Self-pay | Admitting: INTERNAL MEDICINE-ENDOCRINOLOGY-DIABETES AND METABOLISM

## 2022-10-01 MED ORDER — LEVOTHYROXINE 150 MCG TABLET
150.0000 ug | ORAL_TABLET | Freq: Every morning | ORAL | 0 refills | Status: DC
Start: 2022-10-01 — End: 2023-01-02

## 2022-10-08 ENCOUNTER — Other Ambulatory Visit: Payer: Self-pay

## 2022-10-08 ENCOUNTER — Encounter (RURAL_HEALTH_CENTER): Payer: Self-pay | Admitting: INTERNAL MEDICINE-ENDOCRINOLOGY-DIABETES AND METABOLISM

## 2022-10-08 ENCOUNTER — Ambulatory Visit
Payer: BC Managed Care – PPO | Attending: INTERNAL MEDICINE-ENDOCRINOLOGY-DIABETES AND METABOLISM | Admitting: INTERNAL MEDICINE-ENDOCRINOLOGY-DIABETES AND METABOLISM

## 2022-10-08 VITALS — BP 143/88 | HR 80 | Temp 97.5°F | Resp 17 | Ht 65.98 in | Wt 180.8 lb

## 2022-10-08 DIAGNOSIS — Z8639 Personal history of other endocrine, nutritional and metabolic disease: Secondary | ICD-10-CM | POA: Insufficient documentation

## 2022-10-08 DIAGNOSIS — Z923 Personal history of irradiation: Secondary | ICD-10-CM | POA: Insufficient documentation

## 2022-10-08 DIAGNOSIS — N898 Other specified noninflammatory disorders of vagina: Secondary | ICD-10-CM | POA: Insufficient documentation

## 2022-10-08 DIAGNOSIS — E1165 Type 2 diabetes mellitus with hyperglycemia: Secondary | ICD-10-CM | POA: Insufficient documentation

## 2022-10-08 DIAGNOSIS — Z79890 Hormone replacement therapy (postmenopausal): Secondary | ICD-10-CM | POA: Insufficient documentation

## 2022-10-08 DIAGNOSIS — N941 Unspecified dyspareunia: Secondary | ICD-10-CM | POA: Insufficient documentation

## 2022-10-08 DIAGNOSIS — E042 Nontoxic multinodular goiter: Secondary | ICD-10-CM | POA: Insufficient documentation

## 2022-10-08 DIAGNOSIS — Z7985 Long-term (current) use of injectable non-insulin antidiabetic drugs: Secondary | ICD-10-CM | POA: Insufficient documentation

## 2022-10-08 DIAGNOSIS — Z800 Family history of malignant neoplasm of digestive organs: Secondary | ICD-10-CM | POA: Insufficient documentation

## 2022-10-08 DIAGNOSIS — E039 Hypothyroidism, unspecified: Secondary | ICD-10-CM | POA: Insufficient documentation

## 2022-10-08 DIAGNOSIS — Z780 Asymptomatic menopausal state: Secondary | ICD-10-CM | POA: Insufficient documentation

## 2022-10-08 DIAGNOSIS — Z8571 Personal history of Hodgkin lymphoma: Secondary | ICD-10-CM | POA: Insufficient documentation

## 2022-10-08 DIAGNOSIS — Z833 Family history of diabetes mellitus: Secondary | ICD-10-CM | POA: Insufficient documentation

## 2022-10-08 MED ORDER — ESTRADIOL 0.01% (0.1 MG/GRAM) VAGINAL CREAM
2.0000 g | TOPICAL_CREAM | VAGINAL | 1 refills | Status: AC
Start: 2022-10-10 — End: 2023-01-08

## 2022-10-08 MED ORDER — MOUNJARO 15 MG/0.5 ML SUBCUTANEOUS PEN INJECTOR
15.0000 mg | PEN_INJECTOR | SUBCUTANEOUS | 1 refills | Status: DC
Start: 2022-10-08 — End: 2023-01-16

## 2022-10-08 NOTE — Progress Notes (Signed)
Family Medicine, Ambulatory [facility]  [address]  [place] [address]  [phone]    Date: 10/08/2022  Patient Name:  Nicole Carney   Age: 47 y.o.  Attending: Sheryle Hail, MD - Endocrinology, Diabetes and Metabolism  PCP: Estill Cotta, PA-C    I have personally reviewed allergies, chief complaint, medications, past medical history, surgical history, and family history.  I have reviewed referral notes, PCP progress notes and labs and images relevant to this visit.      Assessment/Recommendations:     1. Multinodular goiter  - 2012 hodgkins lymphoma, radiation to neck/chest  - [place] stable 08/2021, repeat 1 year. Recommend 5 years of monitoring this is year 2  - largest nodule 1.0cm  - repeat US scheduled in August    2. Type 2 diabetes mellitus, controlled  - discussed diet and lifestyle modifications at length  - A1C 5.2%  - continue increasing mounjaro  - strong family history of type 2 DM and she has a history of gestational DM    3. Hypothyroidism  - levo increased to due to fatigue, TSH 1.5  - repeat labs in 3 months    5. Family history of pancreatic cancer  - strong family history, mother  - needs to have imaging either EUS or MRI yearly. There are new guidelines for this. Recommend she see gastroenterology  - previously referred to GI at [facility]    6. Menopause  - hot flashes for 2 years, in last year FSH and LH rose to menopausal levels  - vaginal dryness, painful intercourse  - start estrace cream  - sees Gyn next week  - discussed non hormonal and hormonal systemic treatments for menopause    No follow-ups on file.    Chief Complaint:    Chief Complaint   Patient presents with    Follow Up 3 Months     History of Present Illness:   47 year old female presents for evaluation and management multinodular goiter, hypothyroidism, reactive hypoglycemia.  She has history of radiation for Hodgkin's lymphoma.  Has been on thyroid medications since 2015 or 2016.   TSH was 2.7.  Discussed increasing the dose of the medication.  Thyroid nodules are stable over 6 months. , recommend repeat in 1 year.  Discussed that with her in detail.  Prolonged discussion regarding diet lifestyle modification for reactive hypoglycemia.  Discussed medical weight loss.    History of hodkgins, multiple sclerosis.     10/11/2021:  47 year old female returns for follow-up.  History multinodular goiter, hypothyroidism and reactive hypoglycemia.  Patient has been on semaglutide for weight loss and hypoglycemia frequency has reduced drastically.  She is losing weight.  Had labs and I reviewed those with her.  No complaints today    01/03/2022:  47 year old female returns for follow-up.  History of multinodular goiter, hypothyroidism and reactive hypoglycemia.  She is on semaglutide and all of the sugar symptoms have improved.  She is losing weight.  Thyroid nodules are stable.  She has a strong family history of pancreatic cancer.  Has had CA 19 9 testing but not imaging.  I discussed the new guidelines recommend imaging, the frequency the pens on the earliest age of the family members with pancreatic cancer.  She needs to see a gastroenterologist for the screening.    07/04/2022:  47 year old female returns for follow-up.  History of multinodular goiter, hypothyroidism and type 2 diabetes mellitus.  Weight loss has marginally plateaued.  Discussed  Greggory Keen would be more helpful.  Sugar control has been great since she started Ozempic.    10/08/2022:  47 year old female returns for follow-up.  History of multinodular goiter, hypothyroidism and type 2 diabetes.  Has lost 2 lb.  She is frustrated with lack of weight loss.  Discussed continuing to increase Mounjaro.  Discussed going up on thyroid hormone.  Her labs suggested she is menopausal.  She has vaginal dryness and painful intercourse.  Discussed starting Estrace cream.  Also discussed systemic nonhormonal and hormonal treatment for menopause but we  will wait to do either of those.    Allergies:  Allergies   Allergen Reactions    Sulfa (Sulfonamides) Hives/ Urticaria     Medications:    Outpatient Medications Marked as Taking for the 10/08/22 encounter (Office Visit) with Marcelle Overlie, MD   Medication Sig    BD LUER-LOK SYRINGE 3 mL 25 gauge x 1" Syringe Every 14 days    Blood Sugar Diagnostic (BLOOD GLUCOSE TEST) Strip Check blood sugar three times daily    cholecalciferol, vitamin D3, 25 mcg (1,000 unit) Oral Tablet Take 1 Tablet (1,000 Units total) by mouth Once a day    cyanocobalamin (VITAMIN B12) 1,000 mcg/mL Injection Solution Inject 1 mL (1,000 mcg total) into the muscle Every 14 days    lancets (UNIVERSAL 1 LANCETS) 26 gauge Misc Fingersticks 3 times daily    levothyroxine (SYNTHROID) 150 mcg Oral Tablet Take 1 Tablet (150 mcg total) by mouth Every morning    phentermine (ADIPEX-P) 37.5 mg Oral Tablet Take 1 Tablet (37.5 mg total) by mouth Once a day    tirzepatide (MOUNJARO) 12.5 mg/0.5 mL Subcutaneous Pen Injector Inject 0.5 mL (12.5 mg total) under the skin Every 7 days    VUMERITY 231 mg Oral Capsule, Delayed Release(E.C.) Take 1 Capsule (231 mg total) by mouth Once a day       Past Medical History:  Past Medical History:   Diagnosis Date    Gestational diabetes 2021    Hodgkin's lymphoma (CMS [facility]) 2011    Hypothyroidism 2012    MS (multiple sclerosis) (CMS [facility]) 2016     Past Surgical History:   Procedure Laterality Date    HX CESAREAN SECTION  2021    HX TONSILLECTOMY      HX TUBAL LIGATION      LYMPH NODE BIOPSY      removal     Social History:    Social History     Socioeconomic History    Marital status: Married   Tobacco Use    Smoking status: Never     Passive exposure: Never    Smokeless tobacco: Never   Vaping Use    Vaping status: Never Used   Substance and Sexual Activity    Alcohol use: Never    Drug use: Never    Sexual activity: Yes     Partners: Male     Social Determinants of Health     Financial Resource Strain: Low Risk   (07/04/2022)    Financial Resource Strain     SDOH Financial: No   Transportation Needs: Low Risk  (07/04/2022)    Transportation Needs     SDOH Transportation: No   Social Connections: Low Risk  (07/04/2022)    Social Connections     SDOH Social Isolation: 5 or more times a week   Intimate Partner Violence: Low Risk  (07/04/2022)    Intimate Partner Violence     SDOH Domestic  Violence: No   Housing Stability: Low Risk  (07/04/2022)    Housing Stability     SDOH Housing Situation: I have housing.     SDOH Housing Worry: No     Family History:  Family Medical History:       Problem Relation (Age of Onset)    Cancer Mother, Father    Cerebral Aneurysm Father    Cirrhosis Father    No Known Problems Brother          Objective:     Vital Signs:  BP (!) 143/88   Pulse 80   Temp 36.4 C (97.5 F)   Resp 17   Ht 1.676 m (5' 5.98")   Wt 82 kg (180 lb 12.8 oz)   LMP 09/27/2022 (Exact Date)   SpO2 100%   BMI 29.20 kg/m      Body mass index is 29.2 kg/m.    Examination:  General:  Age-appropriate, no acute distress  HEENT:  Eyes clear.  Extraocular movements intact. Moist mucous membranes.  Neck:  Supple, no thyroid masses or adenopathy.  Cardiac:  Regular rate and rhythm, no gallops, murmurs or rubs.  Pulmonary:  Clear to ausculation bilaterally.  Pulmonary effort normal.  Gastrointestinal:  Nontender, nondistended, bowel sounds positive.    Musculoskeletal:  No deformity or lesions.  Skin:  Warm and of normal moisture.  Neurologic:  Alert and oriented.  Nonfocal.  No tremor.  Gait normal.  Psychiatric:  Affect normal.    Diagnostic Review:      No results found for: "HA1C"  No results found for: "TSH", "T3UP", "MTUP1", "FREET4", "T3", "THYSTIMUNQNT", "THYSTIMMUNOG"  No results found for: "TSH", "TSHCASCADE", "T4", "T4FREE", "T3", "FREET3"    Sheryle Hail, MD  Endocrinology, Diabetes and Metabolism    This note may have been partially generated using MModal Fluency Direct system, and there may be some incorrect words,  spellings, and punctuation that were not noted in checking the note before saving.

## 2022-10-22 ENCOUNTER — Encounter (RURAL_HEALTH_CENTER): Payer: Self-pay | Admitting: INTERNAL MEDICINE-ENDOCRINOLOGY-DIABETES AND METABOLISM

## 2022-10-22 ENCOUNTER — Other Ambulatory Visit (RURAL_HEALTH_CENTER): Payer: Self-pay | Admitting: INTERNAL MEDICINE-ENDOCRINOLOGY-DIABETES AND METABOLISM

## 2022-10-22 MED ORDER — MOUNJARO 12.5 MG/0.5 ML SUBCUTANEOUS PEN INJECTOR
12.5000 mg | PEN_INJECTOR | SUBCUTANEOUS | 0 refills | Status: DC
Start: 2022-10-22 — End: 2022-11-14

## 2022-10-22 NOTE — Telephone Encounter (Signed)
Patient requested different dose be sent in.bnr,ma

## 2022-11-14 ENCOUNTER — Other Ambulatory Visit (RURAL_HEALTH_CENTER): Payer: Self-pay | Admitting: INTERNAL MEDICINE-ENDOCRINOLOGY-DIABETES AND METABOLISM

## 2022-11-14 DIAGNOSIS — E1165 Type 2 diabetes mellitus with hyperglycemia: Secondary | ICD-10-CM

## 2022-11-14 MED ORDER — MOUNJARO 12.5 MG/0.5 ML SUBCUTANEOUS PEN INJECTOR
12.5000 mg | PEN_INJECTOR | SUBCUTANEOUS | 0 refills | Status: DC
Start: 2022-11-14 — End: 2022-12-25

## 2022-11-14 NOTE — Telephone Encounter (Signed)
Patient requests refill. Nmt, lpn

## 2022-12-25 ENCOUNTER — Other Ambulatory Visit (RURAL_HEALTH_CENTER): Payer: Self-pay | Admitting: INTERNAL MEDICINE-ENDOCRINOLOGY-DIABETES AND METABOLISM

## 2022-12-25 DIAGNOSIS — E1165 Type 2 diabetes mellitus with hyperglycemia: Secondary | ICD-10-CM

## 2022-12-26 MED ORDER — MOUNJARO 12.5 MG/0.5 ML SUBCUTANEOUS PEN INJECTOR
12.5000 mg | PEN_INJECTOR | SUBCUTANEOUS | 0 refills | Status: DC
Start: 2022-12-26 — End: 2023-01-16

## 2022-12-26 NOTE — Telephone Encounter (Signed)
Patient requests refill. Nmt, lpn

## 2022-12-29 ENCOUNTER — Encounter (RURAL_HEALTH_CENTER): Payer: Self-pay | Admitting: INTERNAL MEDICINE-ENDOCRINOLOGY-DIABETES AND METABOLISM

## 2023-01-02 ENCOUNTER — Other Ambulatory Visit (RURAL_HEALTH_CENTER): Payer: Self-pay | Admitting: INTERNAL MEDICINE-ENDOCRINOLOGY-DIABETES AND METABOLISM

## 2023-01-02 DIAGNOSIS — E039 Hypothyroidism, unspecified: Secondary | ICD-10-CM

## 2023-01-03 MED ORDER — LEVOTHYROXINE 150 MCG TABLET
150.0000 ug | ORAL_TABLET | Freq: Every morning | ORAL | 1 refills | Status: DC
Start: 2023-01-03 — End: 2023-01-16

## 2023-01-03 NOTE — Telephone Encounter (Signed)
Patient requests refill. Nmt, lpn

## 2023-01-07 LAB — HM EGFR: GFR: 110 mL/min/{1.73_m2}

## 2023-01-07 LAB — POCT HGB A1C: POCT HGB A1C: 5 % (ref 4–6)

## 2023-01-10 ENCOUNTER — Other Ambulatory Visit (RURAL_HEALTH_CENTER): Payer: Self-pay

## 2023-01-10 ENCOUNTER — Encounter (RURAL_HEALTH_CENTER): Payer: Self-pay | Admitting: INTERNAL MEDICINE-ENDOCRINOLOGY-DIABETES AND METABOLISM

## 2023-01-16 ENCOUNTER — Encounter (RURAL_HEALTH_CENTER): Payer: Self-pay | Admitting: INTERNAL MEDICINE-ENDOCRINOLOGY-DIABETES AND METABOLISM

## 2023-01-16 ENCOUNTER — Other Ambulatory Visit: Payer: Self-pay

## 2023-01-16 ENCOUNTER — Ambulatory Visit
Payer: BC Managed Care – PPO | Attending: INTERNAL MEDICINE-ENDOCRINOLOGY-DIABETES AND METABOLISM | Admitting: INTERNAL MEDICINE-ENDOCRINOLOGY-DIABETES AND METABOLISM

## 2023-01-16 DIAGNOSIS — E1165 Type 2 diabetes mellitus with hyperglycemia: Secondary | ICD-10-CM | POA: Insufficient documentation

## 2023-01-16 DIAGNOSIS — Z7985 Long-term (current) use of injectable non-insulin antidiabetic drugs: Secondary | ICD-10-CM | POA: Insufficient documentation

## 2023-01-16 DIAGNOSIS — E042 Nontoxic multinodular goiter: Secondary | ICD-10-CM | POA: Insufficient documentation

## 2023-01-16 DIAGNOSIS — Z800 Family history of malignant neoplasm of digestive organs: Secondary | ICD-10-CM | POA: Insufficient documentation

## 2023-01-16 DIAGNOSIS — Z8571 Personal history of Hodgkin lymphoma: Secondary | ICD-10-CM | POA: Insufficient documentation

## 2023-01-16 DIAGNOSIS — E039 Hypothyroidism, unspecified: Secondary | ICD-10-CM | POA: Insufficient documentation

## 2023-01-16 MED ORDER — LEVOTHYROXINE 137 MCG TABLET
137.0000 ug | ORAL_TABLET | Freq: Every morning | ORAL | 4 refills | Status: DC
Start: 2023-01-16 — End: 2024-03-19

## 2023-01-16 MED ORDER — MOUNJARO 15 MG/0.5 ML SUBCUTANEOUS PEN INJECTOR
15.0000 mg | PEN_INJECTOR | SUBCUTANEOUS | 1 refills | Status: DC
Start: 2023-01-16 — End: 2023-03-13

## 2023-01-16 NOTE — Progress Notes (Signed)
Family Medicine, Ambulatory [facility]  [address]  [place] [address]  [phone]    Date: 01/16/2023  Patient Name:  Nicole Carney   Age: 47 y.o.  Attending: Sheryle Hail, MD - Endocrinology, Diabetes and Metabolism  PCP: Estill Cotta, PA-C    I have personally reviewed allergies, chief complaint, medications, past medical history, surgical history, and family history.  I have reviewed referral notes, PCP progress notes and labs and images relevant to this visit.      Assessment/Recommendations:     1. Multinodular goiter  - 2012 hodgkins lymphoma, radiation to neck/chest  - [place] stable 08/2021, repeat 1 year. Recommend 5 years of monitoring this is year 2  - largest nodule 1.0cm  - repeat US every August, stable 12/2022 and scanned into [facility]    2. Type 2 diabetes mellitus, controlled  - discussed diet and lifestyle modifications at length  - A1C 5.2%  - continue increasing mounjaro  - strong family history of type 2 DM and she has a history of gestational DM    3. Hypothyroidism  - decrease levothyroxine to 137 given TSH is slightly low  - repeat labs in 3 months    5. Family history of pancreatic cancer  - strong family history, mother  - needs to have imaging either EUS or MRI yearly. There are new guidelines for this. Recommend she see gastroenterology  - previously referred to GI at [facility]    6. Menopause  - hot flashes for 2 years, in last year FSH and LH rose to menopausal levels  - vaginal dryness, painful intercourse  - did not start Estrace cream, had negative breast biopsy of 2 areas recently.  Cancer history.  She does not want to start hormone replacement in any way right now.    No follow-ups on file.    Chief Complaint:    No chief complaint on file.    History of Present Illness:   47 year old female presents for evaluation and management multinodular goiter, hypothyroidism, reactive hypoglycemia.  She has history of radiation for Hodgkin's  lymphoma.  Has been on thyroid medications since 2015 or 2016.  TSH was 2.7.  Discussed increasing the dose of the medication.  Thyroid nodules are stable over 6 months. , recommend repeat in 1 year.  Discussed that with her in detail.  Prolonged discussion regarding diet lifestyle modification for reactive hypoglycemia.  Discussed medical weight loss.    History of hodkgins, multiple sclerosis.     10/11/2021:  47 year old female returns for follow-up.  History multinodular goiter, hypothyroidism and reactive hypoglycemia.  Patient has been on semaglutide for weight loss and hypoglycemia frequency has reduced drastically.  She is losing weight.  Had labs and I reviewed those with her.  No complaints today    01/03/2022:  47 year old female returns for follow-up.  History of multinodular goiter, hypothyroidism and reactive hypoglycemia.  She is on semaglutide and all of the sugar symptoms have improved.  She is losing weight.  Thyroid nodules are stable.  She has a strong family history of pancreatic cancer.  Has had CA 19 9 testing but not imaging.  I discussed the new guidelines recommend imaging, the frequency the pens on the earliest age of the family members with pancreatic cancer.  She needs to see a gastroenterologist for the screening.    07/04/2022:  47 year old female returns for follow-up.  History of multinodular goiter, hypothyroidism and type 2 diabetes mellitus.  Weight loss  has marginally plateaued.  Discussed Greggory Keen would be more helpful.  Sugar control has been great since she started Ozempic.    10/08/2022:  47 year old female returns for follow-up.  History of multinodular goiter, hypothyroidism and type 2 diabetes.  Has lost 2 lb.  She is frustrated with lack of weight loss.  Discussed continuing to increase Mounjaro.  Discussed going up on thyroid hormone.  Her labs suggested she is menopausal.  She has vaginal dryness and painful intercourse.  Discussed starting Estrace cream.  Also discussed  systemic nonhormonal and hormonal treatment for menopause but we will wait to do either of those.    01/16/2023:  47 year old female returns for follow-up.  Had to change to video due to schedule with her son at school.  She has lost 5 more lb.  Very compliant.  TSH has little bit low, so her hormone requirement is decreasing with weight loss.  She had an ultrasound an outside facility for her thyroid in August.    Allergies:  Allergies   Allergen Reactions    Sulfa (Sulfonamides) Hives/ Urticaria     Medications:    No outpatient medications have been marked as taking for the 01/16/23 encounter (Telemedicine) with Marcelle Overlie, MD.       Past Medical History:  Past Medical History:   Diagnosis Date    Gestational diabetes 2021    Hodgkin's lymphoma (CMS [facility]) 2011    Hypothyroidism 2012    MS (multiple sclerosis) (CMS [facility]) 2016     Past Surgical History:   Procedure Laterality Date    HX CESAREAN SECTION  2021    HX TONSILLECTOMY      HX TUBAL LIGATION      LYMPH NODE BIOPSY      removal     Social History:    Social History     Socioeconomic History    Marital status: Married   Tobacco Use    Smoking status: Never     Passive exposure: Never    Smokeless tobacco: Never   Vaping Use    Vaping status: Never Used   Substance and Sexual Activity    Alcohol use: Never    Drug use: Never    Sexual activity: Yes     Partners: Male     Social Determinants of Health     Financial Resource Strain: Low Risk  (07/04/2022)    Financial Resource Strain     SDOH Financial: No   Transportation Needs: Low Risk  (07/04/2022)    Transportation Needs     SDOH Transportation: No   Social Connections: Low Risk  (07/04/2022)    Social Connections     SDOH Social Isolation: 5 or more times a week   Intimate Partner Violence: Low Risk  (07/04/2022)    Intimate Partner Violence     SDOH Domestic Violence: No   Housing Stability: Low Risk  (07/04/2022)    Housing Stability     SDOH Housing Situation: I have housing.     SDOH Housing Worry: No      Family History:  Family Medical History:       Problem Relation (Age of Onset)    Cancer Mother, Father    Cerebral Aneurysm Father    Cirrhosis Father    No Known Problems Brother          Objective:     Vital Signs:  There were no vitals taken for this visit.     There is  no height or weight on file to calculate BMI.    Examination:  General:  Age-appropriate, no acute distress  HEENT:  Eyes clear.  Extraocular movements intact. Moist mucous membranes.  Neck:  Supple, no thyroid masses or adenopathy.  Cardiac:  Regular rate and rhythm, no gallops, murmurs or rubs.  Pulmonary:  Clear to ausculation bilaterally.  Pulmonary effort normal.  Gastrointestinal:  Nontender, nondistended, bowel sounds positive.    Musculoskeletal:  No deformity or lesions.  Skin:  Warm and of normal moisture.  Neurologic:  Alert and oriented.  Nonfocal.  No tremor.  Gait normal.  Psychiatric:  Affect normal.    Diagnostic Review:      No results found for: "HA1C"  No results found for: "TSH", "T3UP", "MTUP1", "FREET4", "T3", "THYSTIMUNQNT", "THYSTIMMUNOG"  No results found for: "TSH", "TSHCASCADE", "T4", "T4FREE", "T3", "FREET3"    Sheryle Hail, MD  Endocrinology, Diabetes and Metabolism    This note may have been partially generated using MModal Fluency Direct system, and there may be some incorrect words, spellings, and punctuation that were not noted in checking the note before saving.

## 2023-01-21 ENCOUNTER — Encounter (RURAL_HEALTH_CENTER): Payer: Self-pay | Admitting: INTERNAL MEDICINE-ENDOCRINOLOGY-DIABETES AND METABOLISM

## 2023-02-21 IMAGING — MR MRI BRAIN W/O CONTRAST
9 of 10 series · 38 of 48 positions shown · IV contrast (gadolinium)
Comparison: 01/11/2022 MRI brain without contrast.

﻿EXAM:  MRI BRAIN W/O CONTRAST
INDICATION: Multiple sclerosis.
TECHNIQUE: Multiplanar, multisequential MRI of the brain was performed without gadolinium contrast.

[Series 5: DWI · axial · 5.0mm · 1.35mm/px · z∈[-53,+73]mm · 8 of 88 slices shown (1 of 3)]
[im 1/88]
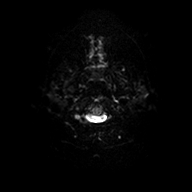
[im 14/88]
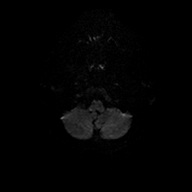
[im 27/88]
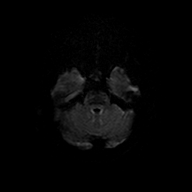
[im 41/88]
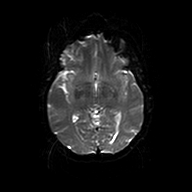
[im 47/88]
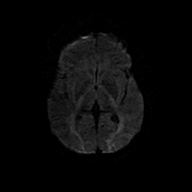
[im 61/88]
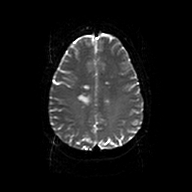
[im 74/88]
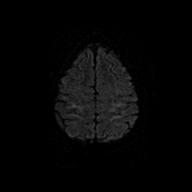
[im 88/88]
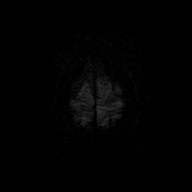

[Series 6: DWI · axial · 5.0mm · 1.35mm/px · z∈[-53,+73]mm · 4 of 22 slices shown (2 of 3)]
[im 1/22]
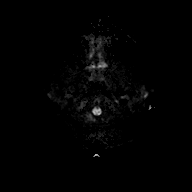
[im 8/22]
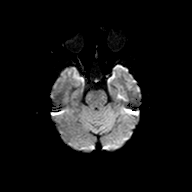
[im 15/22]
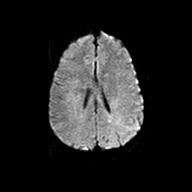
[im 22/22]
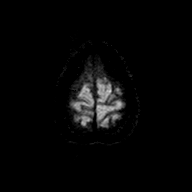

[Series 7: DWI · axial · 5.0mm · 1.35mm/px · z∈[-53,+73]mm · 3 of 22 slices shown (3 of 3)]
[im 1/22]
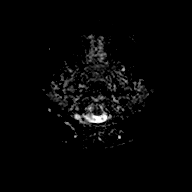
[im 11/22]
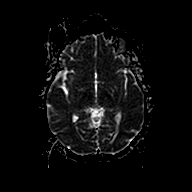
[im 22/22]
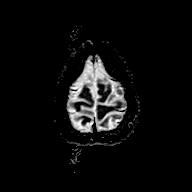

[Series 8: FLAIR · sagittal · 5.0mm · 0.75mm/px · 4 of 28 slices shown (1 of 2)]
[im 1/28]
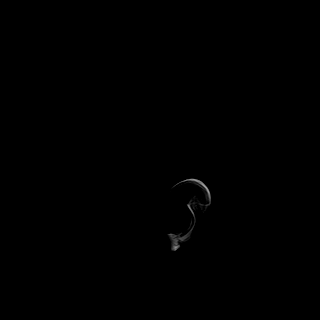
[im 10/28]
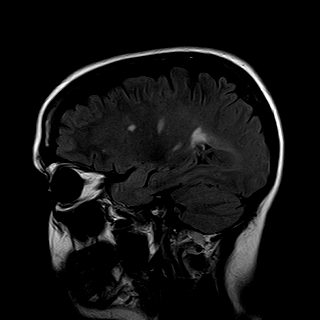
[im 19/28]
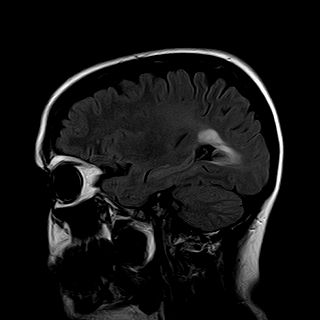
[im 28/28]
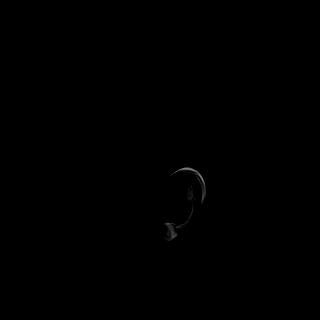

[Series 9: PD · axial · 4.0mm · 0.43mm/px · z∈[-65,+89]mm · 5 of 36 slices shown (1 of 2)]
[im 1/36]
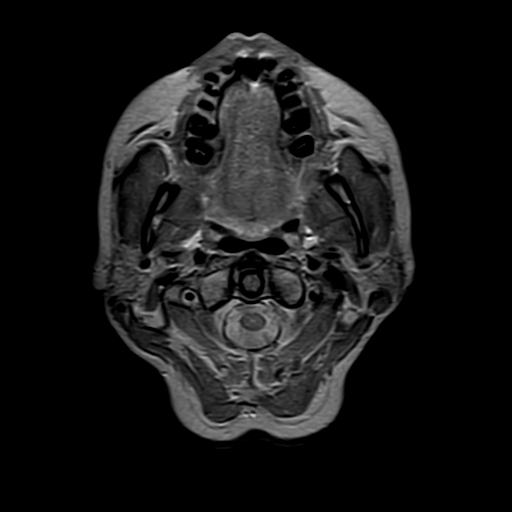
[im 9/36]
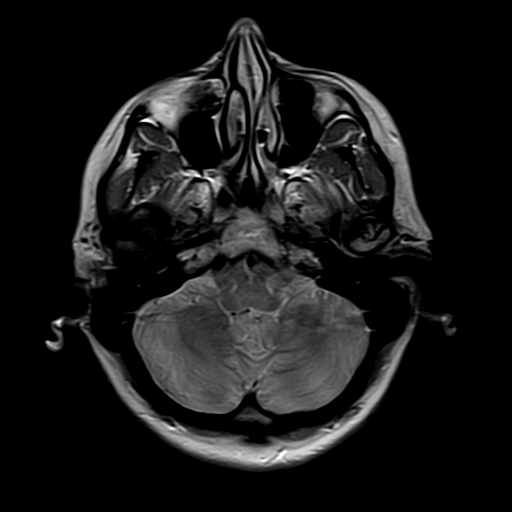
[im 18/36]
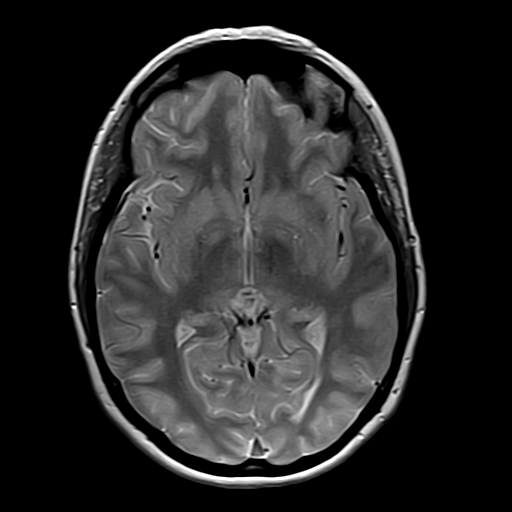
[im 27/36]
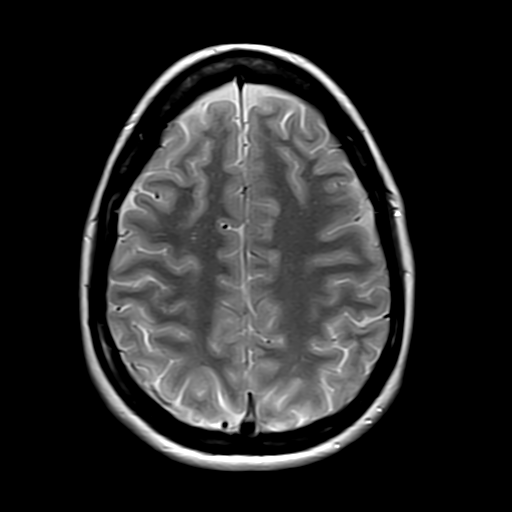
[im 36/36]
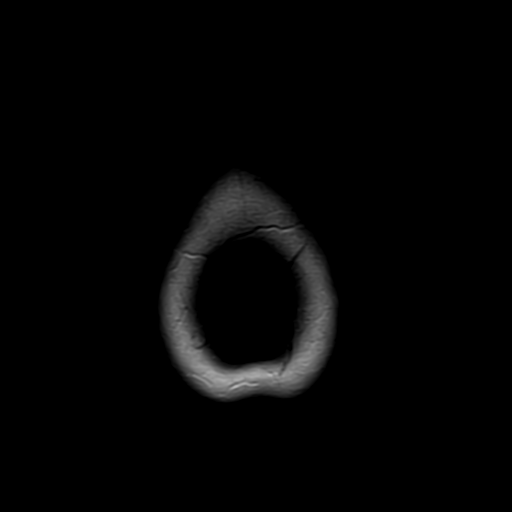

[Series 10: PD · axial · 4.0mm · 0.43mm/px · z∈[-65,+50]mm · 4 of 36 slices shown (2 of 2)]
[im 1/36]
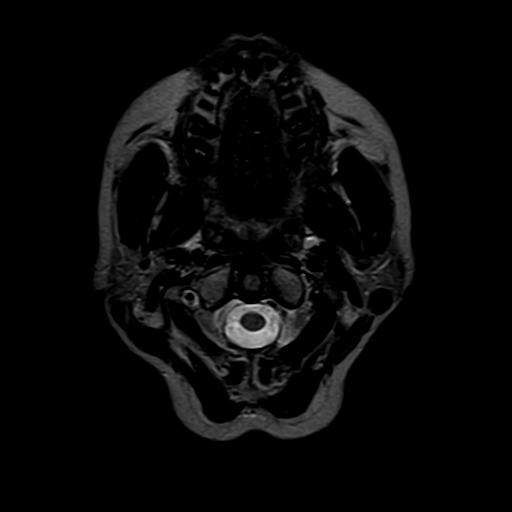
[im 9/36]
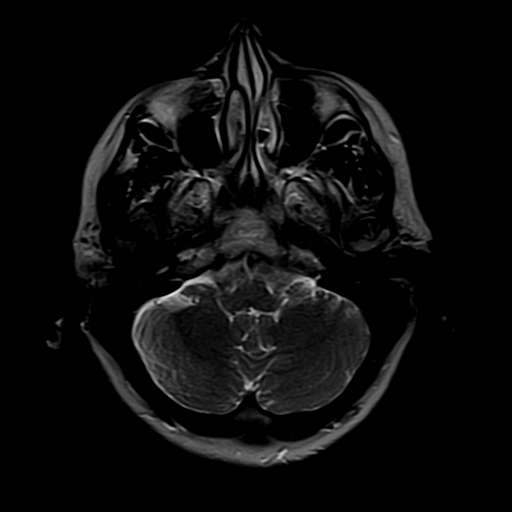
[im 18/36]
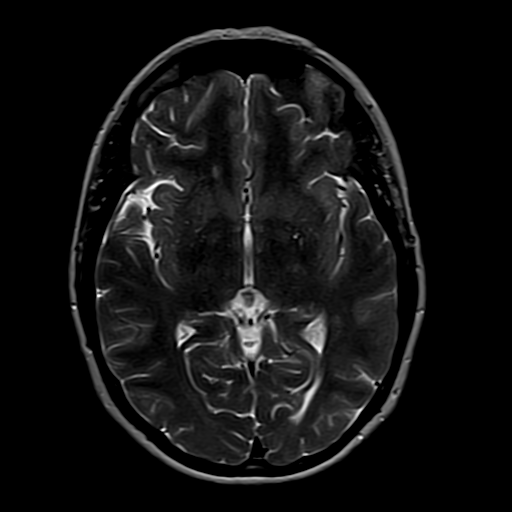
[im 27/36]
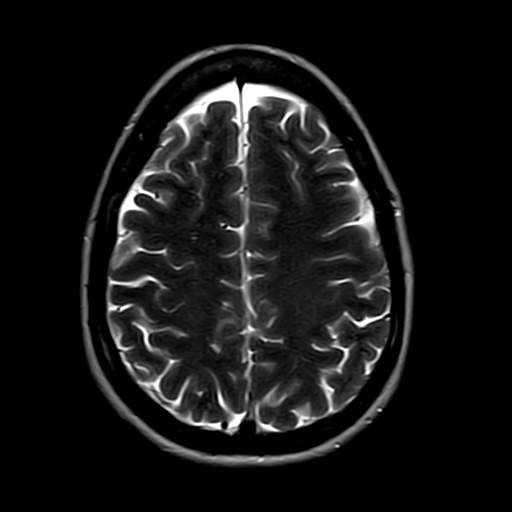

[Series 11: FLAIR · axial · 5.0mm · 0.76mm/px · z∈[-51,+75]mm · 3 of 22 slices shown (2 of 2)]
[im 1/22]
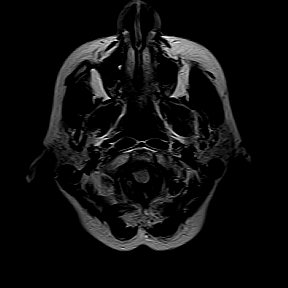
[im 11/22]
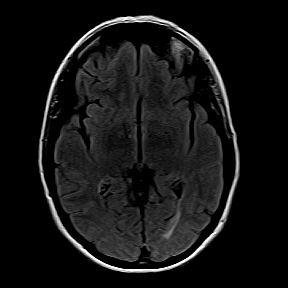
[im 22/22]
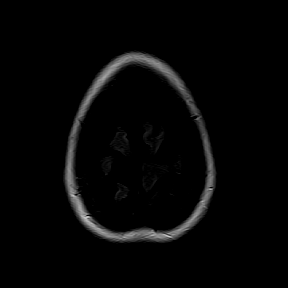

[Series 12: T1 · axial · 5.0mm · 0.69mm/px · z∈[-51,+75]mm · 3 of 22 slices shown]
[im 1/22]
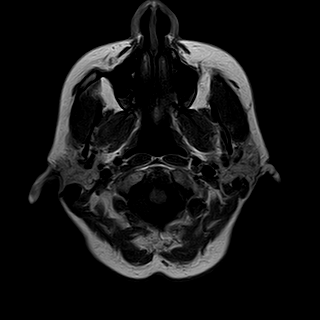
[im 11/22]
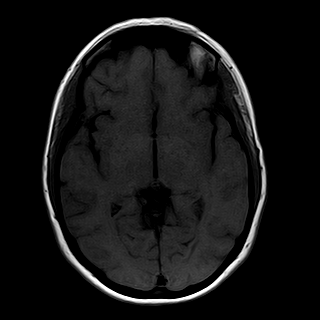
[im 22/22]
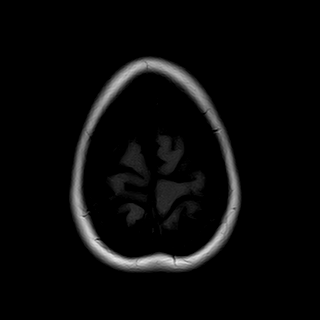

[Series 14: T2 · coronal · 5.0mm · 0.43mm/px · 4 of 28 slices shown]
[im 1/28]
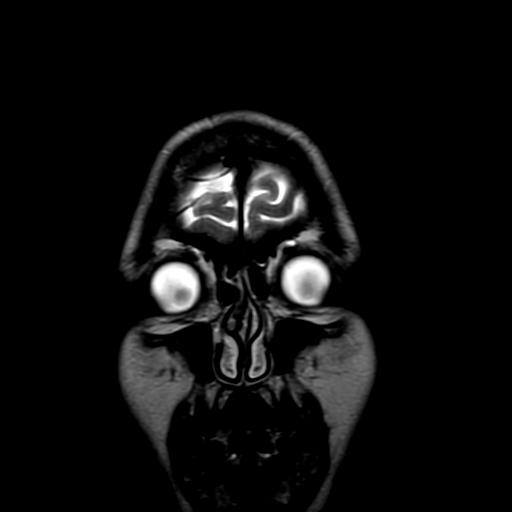
[im 10/28]
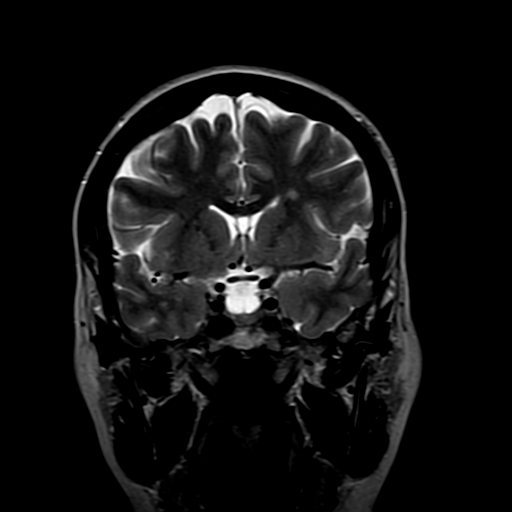
[im 19/28]
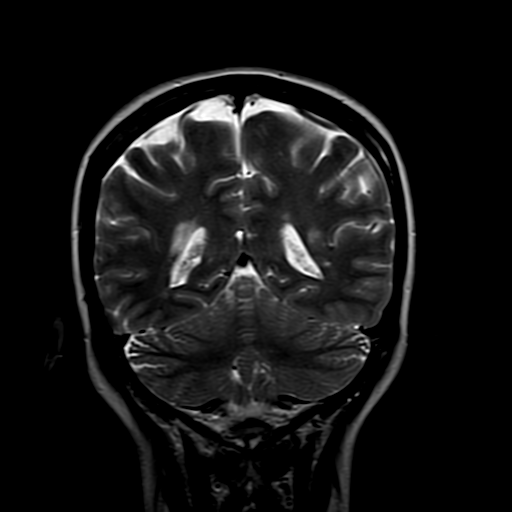
[im 28/28]
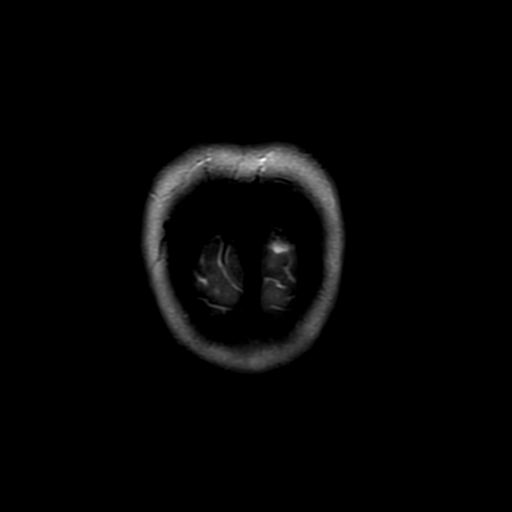

[38 of 48 positions shown; findings below may reference images not displayed]

FINDINGS: Again noted are multiple bilateral white matter lesions characteristic of multiple sclerosis.  These appear unchanged compared to prior exam dated January 11, 2022.  No new lesions are seen.  

There is no evidence of mass effect, hemorrhage, shift of the midline structures, or extra-axial collection.  The ventricles and CSF spaces appear within normal limits.  

The paranasal sinuses and mastoid air cells appear clear.  The orbits are unremarkable.
IMPRESSION: Stable multiple sclerosis.

## 2023-03-13 ENCOUNTER — Other Ambulatory Visit (RURAL_HEALTH_CENTER): Payer: Self-pay | Admitting: INTERNAL MEDICINE-ENDOCRINOLOGY-DIABETES AND METABOLISM

## 2023-03-13 DIAGNOSIS — E1165 Type 2 diabetes mellitus with hyperglycemia: Secondary | ICD-10-CM

## 2023-03-13 MED ORDER — MOUNJARO 15 MG/0.5 ML SUBCUTANEOUS PEN INJECTOR
15.0000 mg | PEN_INJECTOR | SUBCUTANEOUS | 1 refills | Status: DC
Start: 2023-03-13 — End: 2024-03-19

## 2023-03-13 NOTE — Telephone Encounter (Signed)
Pharmacy faxed refill request. Nmt, lpn

## 2023-04-08 ENCOUNTER — Encounter (RURAL_HEALTH_CENTER): Payer: Self-pay | Admitting: INTERNAL MEDICINE-ENDOCRINOLOGY-DIABETES AND METABOLISM

## 2023-04-14 LAB — POCT HGB A1C: POCT HGB A1C: 5 % (ref 4–6)

## 2023-04-15 ENCOUNTER — Other Ambulatory Visit (RURAL_HEALTH_CENTER): Payer: Self-pay

## 2023-04-16 ENCOUNTER — Ambulatory Visit
Payer: BC Managed Care – PPO | Attending: INTERNAL MEDICINE-ENDOCRINOLOGY-DIABETES AND METABOLISM | Admitting: INTERNAL MEDICINE-ENDOCRINOLOGY-DIABETES AND METABOLISM

## 2023-04-16 DIAGNOSIS — E039 Hypothyroidism, unspecified: Secondary | ICD-10-CM | POA: Insufficient documentation

## 2023-04-16 DIAGNOSIS — E1165 Type 2 diabetes mellitus with hyperglycemia: Secondary | ICD-10-CM | POA: Insufficient documentation

## 2023-04-16 DIAGNOSIS — Z7985 Long-term (current) use of injectable non-insulin antidiabetic drugs: Secondary | ICD-10-CM | POA: Insufficient documentation

## 2023-04-16 NOTE — Progress Notes (Signed)
[place] [facility]  FAMILY MEDICINE, [facility]  [address]  [place] [address]  [phone]   Progress Note    Date of Service:  04/16/2023  Nicole Carney, 47 y.o. female  Date of Birth:  1975/06/03  PCP: Estill Cotta, PA-C    TELEMEDICINE DOCUMENTATION:    Patient Location:  MyChart video visit from home address: [address]  [place] [address]    Patient/family aware of provider location:  yes  Patient/family consent for telemedicine:  yes  Examination observed and performed by:  Marcelle Overlie, MD      I personally offered the service to the patient, and obtained verbal consent to provide this service.    Marcelle Overlie, MD      Chief Complaint:  Hypothyroidism and Diabetes, Type 2; Other Manifestations       HPI:  Nicole Carney is a 47 y.o. White female who is seen in the [facility] for hypothyroidism and diabetes.     Past Medical History:   Diagnosis Date    Gestational diabetes 2021    Hodgkin's lymphoma (CMS [facility]) 2011    Hypothyroidism 2012    MS (multiple sclerosis) (CMS HCC) 2016      Past Surgical History:   Procedure Laterality Date    HX CESAREAN SECTION  2021    HX TONSILLECTOMY      HX TUBAL LIGATION      LYMPH NODE BIOPSY      removal      Social History     Tobacco Use    Smoking status: Never     Passive exposure: Never    Smokeless tobacco: Never   Vaping Use    Vaping status: Never Used   Substance Use Topics    Alcohol use: Never    Drug use: Never       Family Medical History:       Problem Relation (Age of Onset)    Cancer Mother, Father    Cerebral Aneurysm Father    Cirrhosis Father    No Known Problems Brother           No outpatient medications have been marked as taking for the 04/16/23 encounter (Telemedicine) with Marcelle Overlie, MD.      Allergies   Allergen Reactions    Sulfa (Sulfonamides) Hives/ Urticaria        REVIEW OF SYSTEMS:  GENERAL: negative for fatigue, significant weight  change, sleep disturbance  HEENT: No recent visual or hearing changes.   RESPIRATORY: No SOB, cough, wheezes  CARDIAC: No chest pains; no palpitations; no edema  GI: no N/V/D, no abdominal pain.  BMs regular  MUSCULOSKELETAL: no myalgias, no joint pain, no edema or erythema  NEUROLOGIC: no headache, weakness, numbness or tingling  GU: no urinary sxs or flank pain  SKIN:  negative for rashes.    Assessment/Plan:    ICD-10-CM    1. Type 2 diabetes mellitus with hyperglycemia, without long-term current use of insulin (CMS HCC)  E11.65       2. Hypothyroidism, unspecified type  E03.9            No follow-ups on file.    - TFTs are normal, continue current levothyroxine TSH up to 0.8  Mounjaro 15mg  weekly, continue A1C is normal  No complaints today    This is an established Patient. Pt has been seen in last 3 years.  Marcelle Overlie, MD

## 2023-04-17 ENCOUNTER — Other Ambulatory Visit: Payer: Self-pay

## 2023-05-29 ENCOUNTER — Encounter (INDEPENDENT_AMBULATORY_CARE_PROVIDER_SITE_OTHER): Payer: Self-pay | Admitting: INTERNAL MEDICINE-ENDOCRINOLOGY-DIABETES AND METABOLISM

## 2023-06-06 ENCOUNTER — Encounter (INDEPENDENT_AMBULATORY_CARE_PROVIDER_SITE_OTHER): Payer: Self-pay | Admitting: INTERNAL MEDICINE-ENDOCRINOLOGY-DIABETES AND METABOLISM

## 2023-06-09 ENCOUNTER — Encounter (INDEPENDENT_AMBULATORY_CARE_PROVIDER_SITE_OTHER): Payer: Self-pay

## 2023-07-07 DIAGNOSIS — R928 Other abnormal and inconclusive findings on diagnostic imaging of breast: Secondary | ICD-10-CM | POA: Insufficient documentation

## 2023-09-06 LAB — HM EGFR: GFR: 110 mL/min/{1.73_m2}

## 2023-09-06 LAB — POCT HGB A1C: POCT HGB A1C: 5.1 % (ref 4–6)

## 2023-09-09 ENCOUNTER — Other Ambulatory Visit: Payer: Self-pay

## 2023-09-09 ENCOUNTER — Other Ambulatory Visit (INDEPENDENT_AMBULATORY_CARE_PROVIDER_SITE_OTHER): Payer: Self-pay

## 2023-09-10 ENCOUNTER — Encounter (INDEPENDENT_AMBULATORY_CARE_PROVIDER_SITE_OTHER): Payer: Self-pay | Admitting: INTERNAL MEDICINE-ENDOCRINOLOGY-DIABETES AND METABOLISM

## 2023-09-10 ENCOUNTER — Ambulatory Visit
Payer: Self-pay | Attending: INTERNAL MEDICINE-ENDOCRINOLOGY-DIABETES AND METABOLISM | Admitting: INTERNAL MEDICINE-ENDOCRINOLOGY-DIABETES AND METABOLISM

## 2023-09-10 ENCOUNTER — Other Ambulatory Visit: Payer: Self-pay

## 2023-09-10 VITALS — BP 130/82 | HR 85 | Resp 17 | Ht 65.98 in | Wt 175.0 lb

## 2023-09-10 DIAGNOSIS — Z7985 Long-term (current) use of injectable non-insulin antidiabetic drugs: Secondary | ICD-10-CM | POA: Insufficient documentation

## 2023-09-10 DIAGNOSIS — Z780 Asymptomatic menopausal state: Secondary | ICD-10-CM | POA: Insufficient documentation

## 2023-09-10 DIAGNOSIS — E042 Nontoxic multinodular goiter: Secondary | ICD-10-CM | POA: Insufficient documentation

## 2023-09-10 DIAGNOSIS — Z800 Family history of malignant neoplasm of digestive organs: Secondary | ICD-10-CM | POA: Insufficient documentation

## 2023-09-10 DIAGNOSIS — Z923 Personal history of irradiation: Secondary | ICD-10-CM | POA: Insufficient documentation

## 2023-09-10 DIAGNOSIS — Z79890 Hormone replacement therapy (postmenopausal): Secondary | ICD-10-CM | POA: Insufficient documentation

## 2023-09-10 DIAGNOSIS — E1165 Type 2 diabetes mellitus with hyperglycemia: Secondary | ICD-10-CM | POA: Insufficient documentation

## 2023-09-10 DIAGNOSIS — E039 Hypothyroidism, unspecified: Secondary | ICD-10-CM | POA: Insufficient documentation

## 2023-09-10 NOTE — Progress Notes (Signed)
 Endocrinology, [facility]  [address]  [place] [address]  [phone]    Date: 09/10/2023  Patient Name:  Nicole Carney   Age: 48 y.o.  Attending: Gerianne Kobs, MD - Endocrinology, Diabetes and Metabolism  PCP: Kathi Panning, PA-C    I have personally reviewed allergies, chief complaint, medications, past medical history, surgical history, and family history.  I have reviewed referral notes, PCP progress notes and labs and images relevant to this visit.      Assessment/Recommendations:     1. Multinodular goiter  - 2012 hodgkins lymphoma, radiation to neck/chest  - US  stable 08/2021, repeat 1 year. Recommend 5 years of monitoring this is year 2  - largest nodule 1.0cm  - repeat US  2 years 12/2024    2. Type 2 diabetes mellitus, controlled  - discussed diet and lifestyle modifications at length  - A1C 5.1%  - mounjaro  15mg  weekly  - strong family history of type 2 DM and she has a history of gestational DM    3. Hypothyroidism  - levo 137mcg daily, TSH 0.5    5. Family history of pancreatic cancer  - strong family history, mother  - needs to have imaging either EUS or MRI yearly. There are new guidelines for this. Recommend she see gastroenterology  - previously referred to GI at [facility]    6. Menopause  - hot flashes for 2 years, in last year FSH and LH rose to menopausal levels  - vaginal dryness, painful intercourse  - following with gyn    No follow-ups on file.    Chief Complaint:    Chief Complaint   Patient presents with    Follow Up 6 Months     History of Present Illness:   48 year old female presents for evaluation and management multinodular goiter, hypothyroidism, reactive hypoglycemia.  She has history of radiation for Hodgkin's lymphoma.  Has been on thyroid medications since 2015 or 2016.  TSH was 2.7.  Discussed increasing the dose of the medication.  Thyroid nodules are stable over 6 months. , recommend repeat in 1 year.  Discussed that with her in detail.   Prolonged discussion regarding diet lifestyle modification for reactive hypoglycemia.  Discussed medical weight loss.    History of hodkgins, multiple sclerosis.     10/11/2021:  48 year old female returns for follow-up.  History multinodular goiter, hypothyroidism and reactive hypoglycemia.  Patient has been on semaglutide  for weight loss and hypoglycemia frequency has reduced drastically.  She is losing weight.  Had labs and I reviewed those with her.  No complaints today    01/03/2022:  48 year old female returns for follow-up.  History of multinodular goiter, hypothyroidism and reactive hypoglycemia.  She is on semaglutide  and all of the sugar symptoms have improved.  She is losing weight.  Thyroid nodules are stable.  She has a strong family history of pancreatic cancer.  Has had CA 19 9 testing but not imaging.  I discussed the new guidelines recommend imaging, the frequency the pens on the earliest age of the family members with pancreatic cancer.  She needs to see a gastroenterologist for the screening.    07/04/2022:  48 year old female returns for follow-up.  History of multinodular goiter, hypothyroidism and type 2 diabetes mellitus.  Weight loss has marginally plateaued.  Discussed Mounjaro  would be more helpful.  Sugar control has been great since she started Ozempic .    10/08/2022:  48 year old female returns for follow-up.  History of multinodular goiter, hypothyroidism and  type 2 diabetes.  Has lost 2 lb.  She is frustrated with lack of weight loss.  Discussed continuing to increase Mounjaro .  Discussed going up on thyroid hormone.  Her labs suggested she is menopausal.  She has vaginal dryness and painful intercourse.  Discussed starting Estrace  cream.  Also discussed systemic nonhormonal and hormonal treatment for menopause but we will wait to do either of those.    09/10/2023:  48 year old female returns for follow-up.  History of multinodular goiter and hypothyroidism.  Weight 175 lb.  No side effects to  Mounjaro .  Reviewed labs with her.  All normal.    Allergies:  Allergies   Allergen Reactions    Sulfa (Sulfonamides) Hives/ Urticaria     Medications:    Outpatient Medications Marked as Taking for the 09/10/23 encounter (Office Visit) with Terrye Fiedler, MD   Medication Sig    BD LUER-LOK SYRINGE 3 mL 25 gauge x 1" Syringe Every 14 days    Blood Sugar Diagnostic (BLOOD GLUCOSE TEST) Strip Check blood sugar three times daily    cholecalciferol, vitamin D3, 25 mcg (1,000 unit) Oral Tablet Take 1 Tablet (1,000 Units total) by mouth Daily    cyanocobalamin (VITAMIN B12) 1,000 mcg/mL Injection Solution Inject 1 mL (1,000 mcg total) into the muscle Every 14 days    lancets (UNIVERSAL 1 LANCETS) 26 gauge Misc Fingersticks 3 times daily    levothyroxine  (SYNTHROID) 137 mcg Oral Tablet Take 1 Tablet (137 mcg total) by mouth Every morning    phentermine (ADIPEX-P) 37.5 mg Oral Tablet Take 1 Tablet (37.5 mg total) by mouth Daily    tirzepatide  (MOUNJARO ) 15 mg/0.5 mL Subcutaneous Pen Injector Inject 0.5 mL (15 mg total) under the skin Every 7 days    VUMERITY 231 mg Oral Capsule, Delayed Release(E.C.) Take 1 Capsule (231 mg total) by mouth Daily       Past Medical History:  Past Medical History:   Diagnosis Date    Gestational diabetes 2021    Hodgkin's lymphoma 2011    Hypothyroidism 2012    MS (multiple sclerosis) (CMS HCC) 2016     Past Surgical History:   Procedure Laterality Date    HX CESAREAN SECTION  2021    HX TONSILLECTOMY      HX TUBAL LIGATION      LYMPH NODE BIOPSY      removal     Social History:    Social History     Socioeconomic History    Marital status: Married   Tobacco Use    Smoking status: Never     Passive exposure: Never    Smokeless tobacco: Never   Vaping Use    Vaping status: Never Used   Substance and Sexual Activity    Alcohol use: Never    Drug use: Never    Sexual activity: Yes     Partners: Male     Social Determinants of Health     Financial Resource Strain: Low Risk  (07/04/2022)    Financial  Resource Strain     SDOH Financial: No   Transportation Needs: Low Risk  (07/04/2022)    Transportation Needs     SDOH Transportation: No   Social Connections: Low Risk  (07/04/2022)    Social Connections     SDOH Social Isolation: 5 or more times a week   Intimate Partner Violence: Low Risk  (07/04/2022)    Intimate Partner Violence     SDOH Domestic Violence: No   Housing  Stability: Low Risk  (07/04/2022)    Housing Stability     SDOH Housing Situation: I have housing.     SDOH Housing Worry: No     Family History:  Family Medical History:       Problem Relation (Age of Onset)    Cancer Mother, Father    Cerebral Aneurysm Father    Cirrhosis Father    No Known Problems Brother          Objective:     Vital Signs:  BP 130/82   Pulse 85   Resp 17   Ht 1.676 m (5' 5.98")   Wt 79.4 kg (175 lb)   LMP 09/03/2023 (Approximate)   SpO2 99%   BMI 28.26 kg/m      Body mass index is 28.26 kg/m.    Examination:  General:  Age-appropriate, no acute distress  HEENT:  Eyes clear.  Extraocular movements intact. Moist mucous membranes.  Neck:  Supple, no thyroid masses or adenopathy.  Cardiac:  Regular rate and rhythm, no gallops, murmurs or rubs.  Pulmonary:  Clear to ausculation bilaterally.  Pulmonary effort normal.  Gastrointestinal:  Nontender, nondistended, bowel sounds positive.    Musculoskeletal:  No deformity or lesions.  Skin:  Warm and of normal moisture.  Neurologic:  Alert and oriented.  Nonfocal.  No tremor.  Gait normal.  Psychiatric:  Affect normal.    Diagnostic Review:      No results found for: "HA1C"  No results found for: "TSH", "T3UP", "MTUP1", "FREET4", "T3", "THYSTIMUNQNT", "THYSTIMMUNOG"  No results found for: "TSH", "TSHCASCADE", "T4", "T4FREE", "T3", "FREET3"    Gerianne Kobs, MD  Endocrinology, Diabetes and Metabolism    This note may have been partially generated using MModal Fluency Direct system, and there may be some incorrect words, spellings, and punctuation that were not noted in checking the  note before saving.

## 2023-10-15 ENCOUNTER — Ambulatory Visit (INDEPENDENT_AMBULATORY_CARE_PROVIDER_SITE_OTHER): Payer: Self-pay | Admitting: INTERNAL MEDICINE-ENDOCRINOLOGY-DIABETES AND METABOLISM

## 2024-03-09 LAB — POCT HGB A1C: POCT HGB A1C: 4.8 % (ref 4–6)

## 2024-03-09 LAB — HM EGFR: GFR: 108 mL/min/1.73mˆ2

## 2024-03-11 ENCOUNTER — Other Ambulatory Visit (INDEPENDENT_AMBULATORY_CARE_PROVIDER_SITE_OTHER): Payer: Self-pay

## 2024-03-15 ENCOUNTER — Encounter (INDEPENDENT_AMBULATORY_CARE_PROVIDER_SITE_OTHER): Payer: Self-pay

## 2024-03-15 ENCOUNTER — Ambulatory Visit (INDEPENDENT_AMBULATORY_CARE_PROVIDER_SITE_OTHER): Payer: Self-pay | Admitting: INTERNAL MEDICINE-ENDOCRINOLOGY-DIABETES AND METABOLISM

## 2024-03-18 DIAGNOSIS — K219 Gastro-esophageal reflux disease without esophagitis: Secondary | ICD-10-CM | POA: Insufficient documentation

## 2024-03-18 DIAGNOSIS — C8190 Hodgkin lymphoma, unspecified, unspecified site: Secondary | ICD-10-CM | POA: Insufficient documentation

## 2024-03-19 ENCOUNTER — Other Ambulatory Visit: Payer: Self-pay

## 2024-03-19 ENCOUNTER — Ambulatory Visit
Attending: INTERNAL MEDICINE-ENDOCRINOLOGY-DIABETES AND METABOLISM | Admitting: INTERNAL MEDICINE-ENDOCRINOLOGY-DIABETES AND METABOLISM

## 2024-03-19 DIAGNOSIS — E1165 Type 2 diabetes mellitus with hyperglycemia: Secondary | ICD-10-CM | POA: Insufficient documentation

## 2024-03-19 DIAGNOSIS — E039 Hypothyroidism, unspecified: Secondary | ICD-10-CM | POA: Insufficient documentation

## 2024-03-19 MED ORDER — LEVOTHYROXINE 137 MCG TABLET: 137.0000 ug | ORAL_TABLET | Freq: Every morning | ORAL | 4 refills | Status: DC

## 2024-03-19 MED ORDER — MOUNJARO 15 MG/0.5 ML SUBCUTANEOUS PEN INJECTOR
15.0000 mg | PEN_INJECTOR | SUBCUTANEOUS | 1 refills | Status: AC
Start: 2024-03-19 — End: ?

## 2024-04-11 ENCOUNTER — Other Ambulatory Visit (INDEPENDENT_AMBULATORY_CARE_PROVIDER_SITE_OTHER): Payer: Self-pay | Admitting: INTERNAL MEDICINE-ENDOCRINOLOGY-DIABETES AND METABOLISM

## 2024-04-12 NOTE — Telephone Encounter (Signed)
 Pharmacy requested refill.bnr,ma

## 2024-05-24 ENCOUNTER — Encounter (INDEPENDENT_AMBULATORY_CARE_PROVIDER_SITE_OTHER): Payer: Self-pay

## 2024-05-26 ENCOUNTER — Encounter (INDEPENDENT_AMBULATORY_CARE_PROVIDER_SITE_OTHER): Payer: Self-pay

## 2024-06-09 ENCOUNTER — Encounter (INDEPENDENT_AMBULATORY_CARE_PROVIDER_SITE_OTHER): Payer: Self-pay

## 2024-06-09 ENCOUNTER — Encounter (INDEPENDENT_AMBULATORY_CARE_PROVIDER_SITE_OTHER): Payer: Self-pay | Admitting: INTERNAL MEDICINE-ENDOCRINOLOGY-DIABETES AND METABOLISM

## 2024-08-04 ENCOUNTER — Ambulatory Visit (INDEPENDENT_AMBULATORY_CARE_PROVIDER_SITE_OTHER): Payer: Self-pay | Admitting: INTERNAL MEDICINE-ENDOCRINOLOGY-DIABETES AND METABOLISM

## 2024-08-12 ENCOUNTER — Ambulatory Visit (INDEPENDENT_AMBULATORY_CARE_PROVIDER_SITE_OTHER): Admitting: INTERNAL MEDICINE-ENDOCRINOLOGY-DIABETES AND METABOLISM
